# Patient Record
Sex: Male | Born: 1970 | Race: White | Hispanic: No | Marital: Married | Smoking: Current some day smoker
Health system: Southern US, Community
[De-identification: ages and names within clinical notes are randomized; demographics above are authoritative.]

## PROBLEM LIST (undated history)

## (undated) DIAGNOSIS — Z87442 Personal history of urinary calculi: Secondary | ICD-10-CM

## (undated) DIAGNOSIS — T7840XA Allergy, unspecified, initial encounter: Secondary | ICD-10-CM

## (undated) DIAGNOSIS — M549 Dorsalgia, unspecified: Secondary | ICD-10-CM

## (undated) HISTORY — PX: COLONOSCOPY: SHX174

## (undated) HISTORY — DX: Dorsalgia, unspecified: M54.9

## (undated) HISTORY — PX: NASAL SINUS SURGERY: SHX719

## (undated) HISTORY — DX: Allergy, unspecified, initial encounter: T78.40XA

---

## 2002-11-28 ENCOUNTER — Encounter: Admission: RE | Admit: 2002-11-28 | Discharge: 2003-02-26 | Payer: Self-pay | Admitting: Family Medicine

## 2004-09-24 ENCOUNTER — Encounter: Admission: RE | Admit: 2004-09-24 | Discharge: 2004-09-24 | Payer: Self-pay | Admitting: Internal Medicine

## 2004-09-28 ENCOUNTER — Encounter: Admission: RE | Admit: 2004-09-28 | Discharge: 2004-09-28 | Payer: Self-pay | Admitting: Internal Medicine

## 2007-08-15 ENCOUNTER — Ambulatory Visit (HOSPITAL_COMMUNITY): Admission: RE | Admit: 2007-08-15 | Discharge: 2007-08-15 | Payer: Self-pay | Admitting: *Deleted

## 2011-05-12 NOTE — Op Note (Signed)
Jerome Robinson, Jerome Robinson                ACCOUNT NO.:  [id number]   MEDICAL RECORD NO.:  [id number]          PATIENT TYPE:  AMB   LOCATION:  ENDO                         FACILITY:  [facility]   PHYSICIAN:  Georgiana Spinner, M.D.    DATE OF BIRTH:  September 21, 1971   DATE OF PROCEDURE:  08/15/2007  DATE OF DISCHARGE:                               OPERATIVE REPORT   PROCEDURES:  Colonoscopy.   INDICATIONS:  Colon polyps screening.   ANESTHESIA:  Demerol 100 mg, Versed 10 mg.   PROCEDURE:  With the patient mildly sedated in the left lateral  decubitus position, the Pentax videoscopic colonoscope was inserted in  the rectum after normal rectal exam; and passed under direct vision to  the cecum, identified by ileocecal valve and appendiceal orifice, both  of which were photographed.  From this point the colonoscope was slowly  withdrawn, taking circumferential views of the colonic mucosa and  stopping only in the rectum, which appeared normal on direct and showed  hemorrhoids on retroflexed view.  The endoscope was straightened and  withdrawn.  The patient's vital signs, pulse oximetry remained stable.  The patient tolerated the procedure well without apparent complications.   FINDINGS:  Internal hemorrhoids, otherwise an unremarkable exam.   PLAN:  Have patient follow-up with me in 5 years or as needed.           ______________________________  Georgiana Spinner, M.D.     GMO/MEDQ  D:  08/15/2007  T:  08/15/2007  Job:  [id number]

## 2013-02-20 ENCOUNTER — Other Ambulatory Visit: Payer: Self-pay | Admitting: Internal Medicine

## 2013-02-20 DIAGNOSIS — N5089 Other specified disorders of the male genital organs: Secondary | ICD-10-CM

## 2013-02-22 ENCOUNTER — Other Ambulatory Visit: Payer: Self-pay | Admitting: Internal Medicine

## 2013-02-22 ENCOUNTER — Ambulatory Visit
Admission: RE | Admit: 2013-02-22 | Discharge: 2013-02-22 | Disposition: A | Discharge status: Home / Self Care | Payer: 59 | Source: Ambulatory Visit | Attending: Internal Medicine | Admitting: Internal Medicine

## 2013-02-22 DIAGNOSIS — N5089 Other specified disorders of the male genital organs: Secondary | ICD-10-CM

## 2013-06-01 ENCOUNTER — Encounter: Payer: Self-pay | Admitting: Gastroenterology

## 2013-06-27 ENCOUNTER — Encounter: Payer: Self-pay | Admitting: Internal Medicine

## 2013-06-29 ENCOUNTER — Other Ambulatory Visit: Payer: Self-pay | Admitting: Physical Medicine and Rehabilitation

## 2013-06-29 DIAGNOSIS — M79604 Pain in right leg: Secondary | ICD-10-CM

## 2013-06-29 DIAGNOSIS — Z139 Encounter for screening, unspecified: Secondary | ICD-10-CM

## 2013-06-29 DIAGNOSIS — M545 Low back pain, unspecified: Secondary | ICD-10-CM

## 2013-07-13 ENCOUNTER — Ambulatory Visit
Admission: RE | Admit: 2013-07-13 | Discharge: 2013-07-13 | Disposition: A | Discharge status: Home / Self Care | Payer: 59 | Source: Ambulatory Visit | Attending: Physical Medicine and Rehabilitation | Admitting: Physical Medicine and Rehabilitation

## 2013-07-13 DIAGNOSIS — Z139 Encounter for screening, unspecified: Secondary | ICD-10-CM

## 2013-07-13 DIAGNOSIS — M545 Low back pain, unspecified: Secondary | ICD-10-CM

## 2013-07-13 DIAGNOSIS — M79604 Pain in right leg: Secondary | ICD-10-CM

## 2013-07-17 ENCOUNTER — Ambulatory Visit (AMBULATORY_SURGERY_CENTER): Payer: 59 | Admitting: *Deleted

## 2013-07-17 ENCOUNTER — Encounter: Payer: Self-pay | Admitting: Internal Medicine

## 2013-07-17 VITALS — Ht 71.0 in | Wt 193.6 lb

## 2013-07-17 DIAGNOSIS — Z800 Family history of malignant neoplasm of digestive organs: Secondary | ICD-10-CM

## 2013-07-17 MED ORDER — MOVIPREP 100 G PO SOLR: ORAL | Status: DC

## 2013-07-17 NOTE — Progress Notes (Signed)
No egg or soy allergy  Pt had last colonoscopy with Dr. Virginia Rochester in 2008. He states he didn't have polyps  Dr. Wende Neighbors report is in pt's file

## 2013-08-01 ENCOUNTER — Ambulatory Visit (AMBULATORY_SURGERY_CENTER): Payer: 59 | Admitting: Internal Medicine

## 2013-08-01 ENCOUNTER — Encounter: Payer: Self-pay | Admitting: Internal Medicine

## 2013-08-01 VITALS — BP 99/73 | HR 54 | Temp 97.0°F | Resp 19 | Ht 71.0 in | Wt 193.0 lb

## 2013-08-01 DIAGNOSIS — D126 Benign neoplasm of colon, unspecified: Secondary | ICD-10-CM

## 2013-08-01 DIAGNOSIS — Z1211 Encounter for screening for malignant neoplasm of colon: Secondary | ICD-10-CM

## 2013-08-01 DIAGNOSIS — Z800 Family history of malignant neoplasm of digestive organs: Secondary | ICD-10-CM

## 2013-08-01 HISTORY — PX: COLONOSCOPY: SHX174

## 2013-08-01 MED ORDER — SODIUM CHLORIDE 0.9 % IV SOLN: 500.0000 mL | INTRAVENOUS | Status: DC

## 2013-08-01 NOTE — Progress Notes (Signed)
Called to room to assist during endoscopic procedure.  Patient ID and intended procedure confirmed with present staff. Received instructions for my participation in the procedure from the performing physician.  

## 2013-08-01 NOTE — Progress Notes (Signed)
Patient did not have preoperative order for IV antibiotic SSI prophylaxis. (G8918)  Patient did not experience any of the following events: a burn prior to discharge; a fall within the facility; wrong site/side/patient/procedure/implant event; or a hospital transfer or hospital admission upon discharge from the facility. (G8907)  

## 2013-08-01 NOTE — Patient Instructions (Addendum)

## 2013-08-01 NOTE — Op Note (Signed)
[facility] [address] [place], [address]   COLONOSCOPY PROCEDURE REPORT  PATIENT: Jerome Robinson, Jerome Robinson  MR#: [id number] BIRTHDATE: 1971-07-12 , 42  yrs. old GENDER: Male ENDOSCOPIST: Roxy Cedar, MD REFERRED AO:[id number] Ricki Miller, M.D. PROCEDURE DATE:  08/01/2013 PROCEDURE:   Colonoscopy with snare polypectomy x 1 First Screening Colonoscopy - Avg.  risk and is 50 yrs.  old or older - No.  Prior Negative Screening - Now for repeat screening. Above average risk  History of Adenoma - Now for follow-up colonoscopy & has been > or = to 3 yrs.  N/A  Polyps Removed Today? Yes. ASA CLASS:   Class II INDICATIONS:Patient's immediate family history of colon cancer.  Mom @ 35.  Negative index exam 2008 (Dr Virginia Rochester) MEDICATIONS: MAC sedation, administered by CRNA and propofol (Diprivan) 260mg  IV  DESCRIPTION OF PROCEDURE:   After the risks benefits and alternatives of the procedure were thoroughly explained, informed consent was obtained.  A digital rectal exam revealed no abnormalities of the rectum.   The LB [id number]  endoscope was introduced through the anus and advanced to the cecum, which was identified by both the appendix and ileocecal valve. No adverse events experienced.   The quality of the prep was excellent, using MoviPrep  The instrument was then slowly withdrawn as the colon was fully examined.      COLON FINDINGS: A diminutive polyp was found in the sigmoid colon. A polypectomy was performed with a cold snare.  The resection was complete and the polyp tissue was completely retrieved.   The colon was otherwise normal.  There was no diverticulosis, inflammation, other polyps or cancers .  Retroflexed views revealed internal hemorrhoids. The time to cecum=1 minutes 47 seconds.  Withdrawal time=9 minutes 20 seconds.  The scope was withdrawn and the procedure completed. COMPLICATIONS: There were no complications.  ENDOSCOPIC IMPRESSION: 1.   Diminutive  polyp was found in the sigmoid colon; polypectomy was performed with a cold snare 2.   The colon was otherwise normal  RECOMMENDATIONS: 1. Follow up colonoscopy in 5 years (Family Hx)   eSigned:  Roxy Cedar, MD 08/01/2013 11:49 AM   cc: Juline Patch, MD and The Patient   PATIENT NAME:  Jerome Robinson, Jerome Robinson MR#: [id number]

## 2013-08-02 ENCOUNTER — Telehealth: Payer: Self-pay | Admitting: *Deleted

## 2013-08-02 NOTE — Telephone Encounter (Signed)
  Follow up Call-  Call back number 08/01/2013  Post procedure Call Back phone  # [phone]  Permission to leave phone message Yes    [facility]

## 2013-08-14 ENCOUNTER — Encounter: Payer: Self-pay | Admitting: Internal Medicine

## 2013-12-28 HISTORY — PX: BACK SURGERY: SHX140

## 2015-11-19 ENCOUNTER — Other Ambulatory Visit: Payer: Self-pay | Admitting: Internal Medicine

## 2015-11-19 DIAGNOSIS — R7989 Other specified abnormal findings of blood chemistry: Secondary | ICD-10-CM

## 2015-11-19 DIAGNOSIS — R945 Abnormal results of liver function studies: Secondary | ICD-10-CM

## 2015-11-25 ENCOUNTER — Other Ambulatory Visit: Payer: Self-pay

## 2015-11-29 ENCOUNTER — Ambulatory Visit
Admission: RE | Admit: 2015-11-29 | Discharge: 2015-11-29 | Disposition: A | Discharge status: Home / Self Care | Payer: Self-pay | Source: Ambulatory Visit | Attending: Internal Medicine | Admitting: Internal Medicine

## 2015-11-29 DIAGNOSIS — R7989 Other specified abnormal findings of blood chemistry: Secondary | ICD-10-CM

## 2015-11-29 DIAGNOSIS — R945 Abnormal results of liver function studies: Secondary | ICD-10-CM

## 2017-06-09 DIAGNOSIS — Z125 Encounter for screening for malignant neoplasm of prostate: Secondary | ICD-10-CM | POA: Diagnosis not present

## 2017-06-09 DIAGNOSIS — Z0000 Encounter for general adult medical examination without abnormal findings: Secondary | ICD-10-CM | POA: Diagnosis not present

## 2017-07-21 DIAGNOSIS — G4700 Insomnia, unspecified: Secondary | ICD-10-CM | POA: Diagnosis not present

## 2017-07-21 DIAGNOSIS — R002 Palpitations: Secondary | ICD-10-CM | POA: Diagnosis not present

## 2017-07-21 DIAGNOSIS — Z0000 Encounter for general adult medical examination without abnormal findings: Secondary | ICD-10-CM | POA: Diagnosis not present

## 2017-07-21 DIAGNOSIS — N401 Enlarged prostate with lower urinary tract symptoms: Secondary | ICD-10-CM | POA: Diagnosis not present

## 2018-07-27 DIAGNOSIS — Z125 Encounter for screening for malignant neoplasm of prostate: Secondary | ICD-10-CM | POA: Diagnosis not present

## 2018-07-27 DIAGNOSIS — Z0000 Encounter for general adult medical examination without abnormal findings: Secondary | ICD-10-CM | POA: Diagnosis not present

## 2018-08-04 ENCOUNTER — Encounter: Payer: Self-pay | Admitting: Internal Medicine

## 2018-08-04 DIAGNOSIS — Z0000 Encounter for general adult medical examination without abnormal findings: Secondary | ICD-10-CM | POA: Diagnosis not present

## 2019-03-16 DIAGNOSIS — L720 Epidermal cyst: Secondary | ICD-10-CM | POA: Diagnosis not present

## 2019-03-16 DIAGNOSIS — L718 Other rosacea: Secondary | ICD-10-CM | POA: Diagnosis not present

## 2019-03-16 DIAGNOSIS — L573 Poikiloderma of Civatte: Secondary | ICD-10-CM | POA: Diagnosis not present

## 2021-07-14 ENCOUNTER — Other Ambulatory Visit: Payer: Self-pay | Admitting: Student

## 2021-07-14 DIAGNOSIS — M5416 Radiculopathy, lumbar region: Secondary | ICD-10-CM

## 2021-08-07 ENCOUNTER — Other Ambulatory Visit: Payer: Self-pay

## 2021-08-07 ENCOUNTER — Ambulatory Visit
Admission: RE | Admit: 2021-08-07 | Discharge: 2021-08-07 | Disposition: A | Discharge status: Home / Self Care | Payer: Worker's Compensation | Source: Ambulatory Visit | Attending: Student | Admitting: Student

## 2021-08-07 DIAGNOSIS — M5416 Radiculopathy, lumbar region: Secondary | ICD-10-CM

## 2022-01-04 IMAGING — CT CT L SPINE W/O CM
4 series · 15 of 33 positions shown, 18 images · non-contrast
Comparison: MRI lumbar spine dated June 24, 2021.

CLINICAL DATA: Lumbar radiculopathy.  History of prior discectomy.

EXAM:
CT LUMBAR SPINE WITHOUT CONTRAST
TECHNIQUE: Multidetector CT imaging of the lumbar spine was performed without
intravenous contrast administration. Intradiscal contrast was
administered at an outside facility prior to scanning. Multiplanar
CT image reconstructions were also generated.

[Series 3: l-spine 2.00 br40 s3 (person_name) · axial · 0.31mm/px · z∈[+1427,+1525]mm · 5 of 75 slices shown, 7 images]
[im 13/75  soft-tissue]
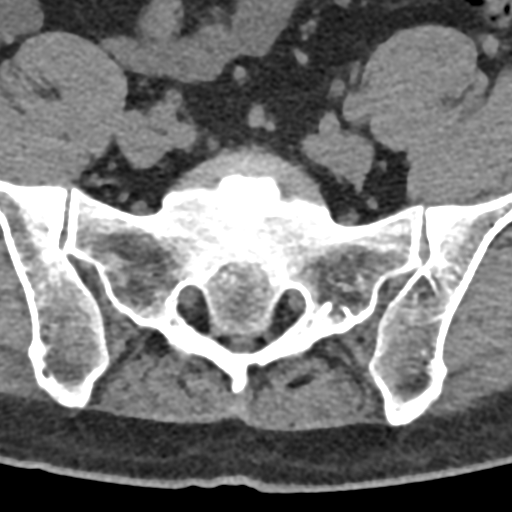
[im 13/75  bone]
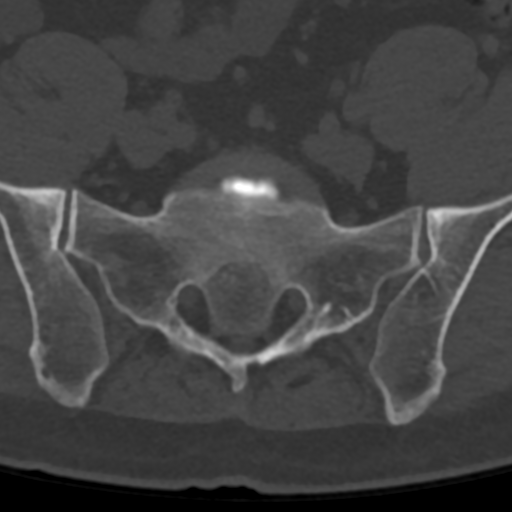
[im 25/75  bone]
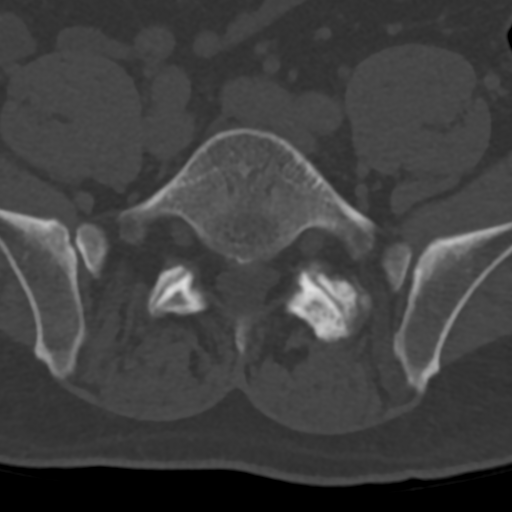
[im 38/75  bone]
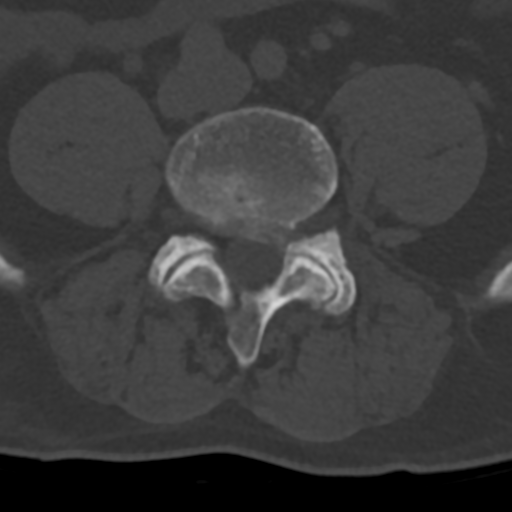
[im 50/75  bone]
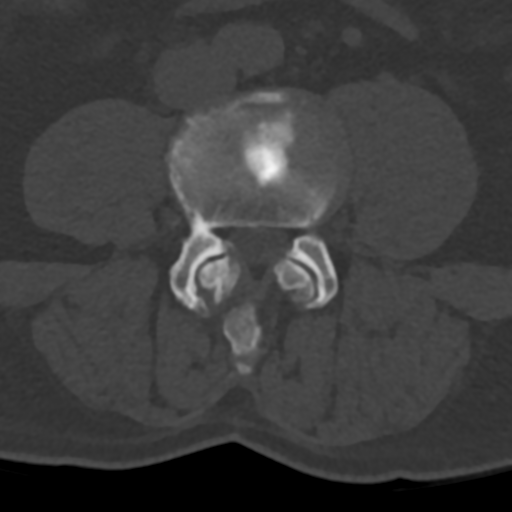
[im 62/75  soft-tissue]
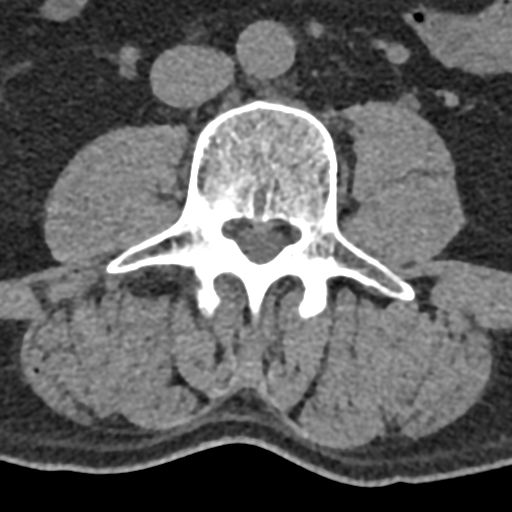
[im 62/75  bone]
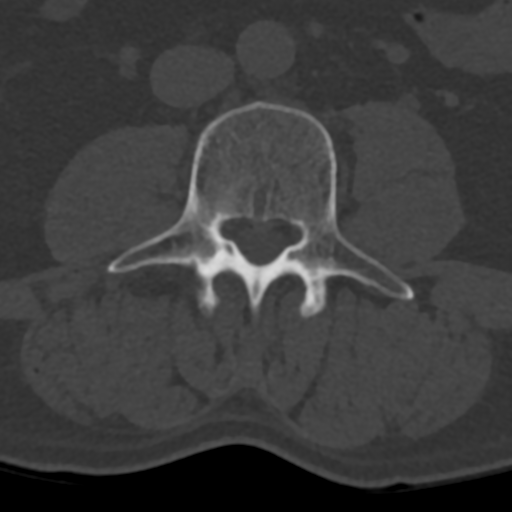

[Series 4: l-spine 2.00 br60 s3 lspine bone · axial · 0.31mm/px · z∈[+1427,+1451]mm · 2 of 75 slices shown]
[im 13/75  bone]
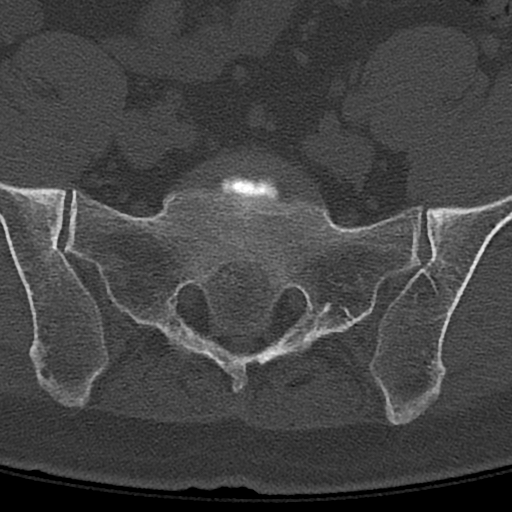
[im 25/75  bone]
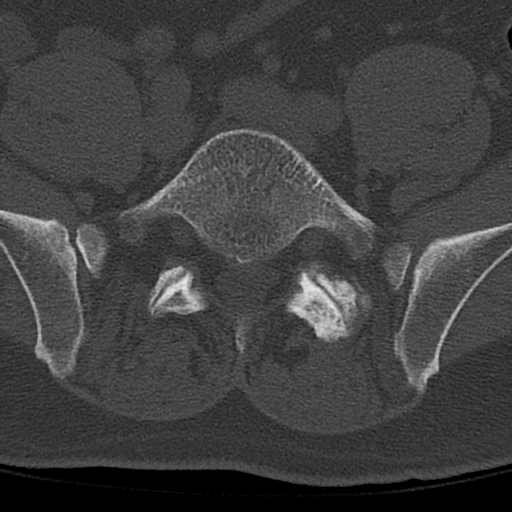

[Series 7: l-spine 2.00 br44 s3 sag soft · sagittal · 0.30mm/px · 5 of 80 slices shown, 6 images]
[im 27/80  bone]
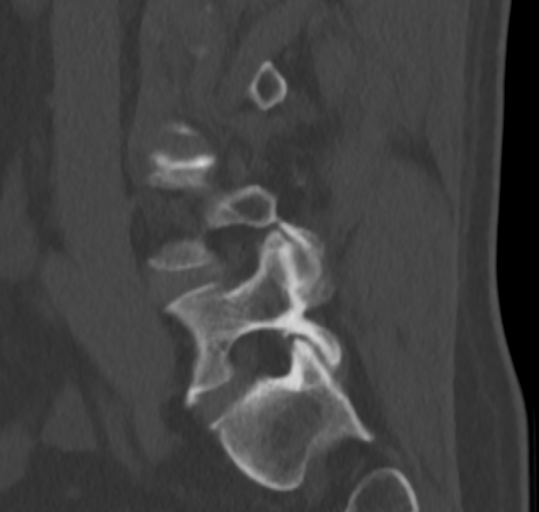
[im 33/80  bone]
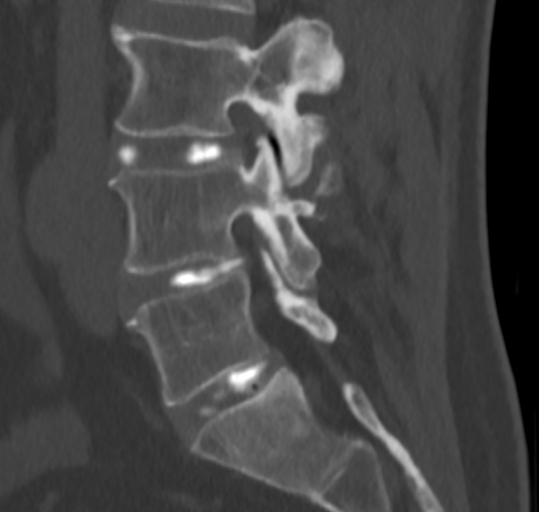
[im 40/80  soft-tissue]
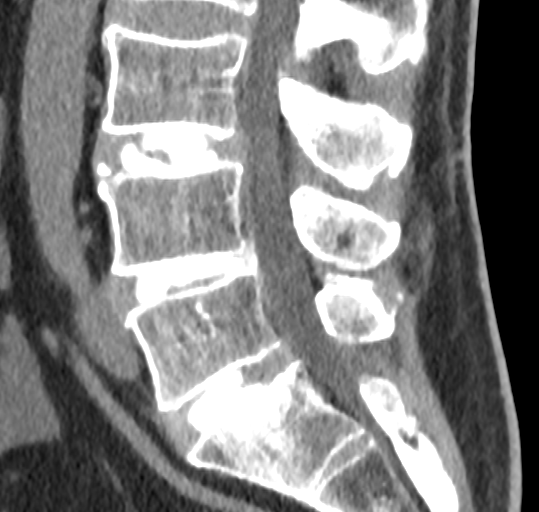
[im 40/80  bone]
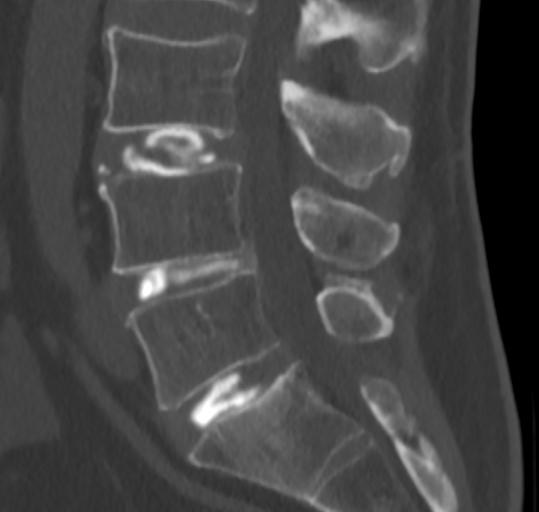
[im 47/80  bone]
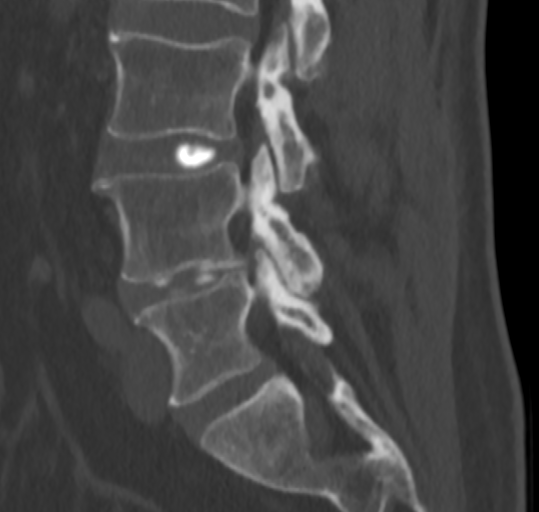
[im 53/80  bone]
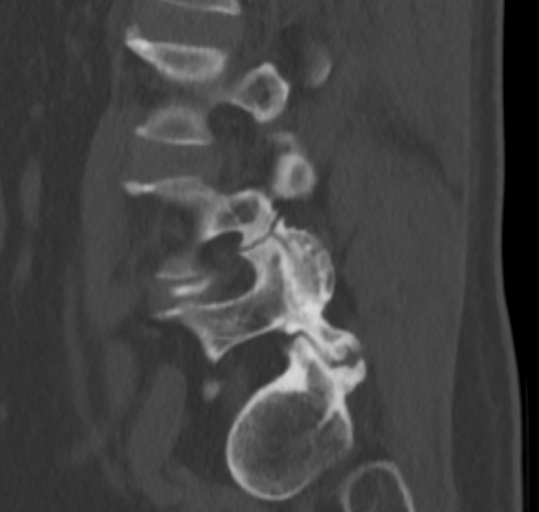

[Series 9: l-spine 2.00 br60 s3 cor bone · coronal · 0.30mm/px · 3 of 79 slices shown]
[im 16/79  bone]
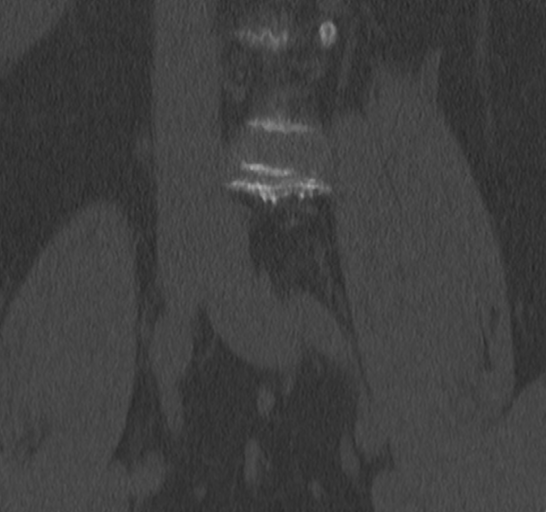
[im 32/79  bone]
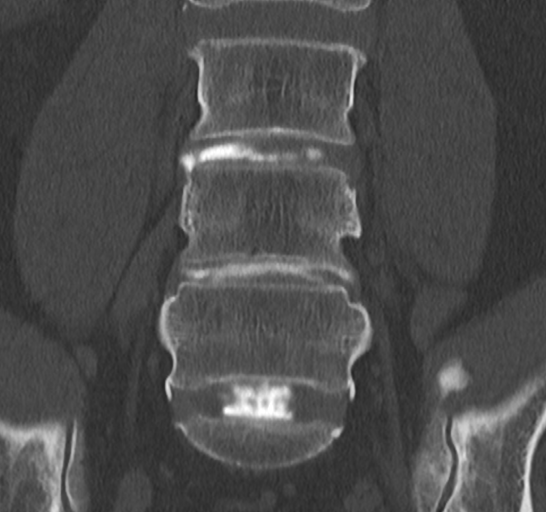
[im 47/79  bone]
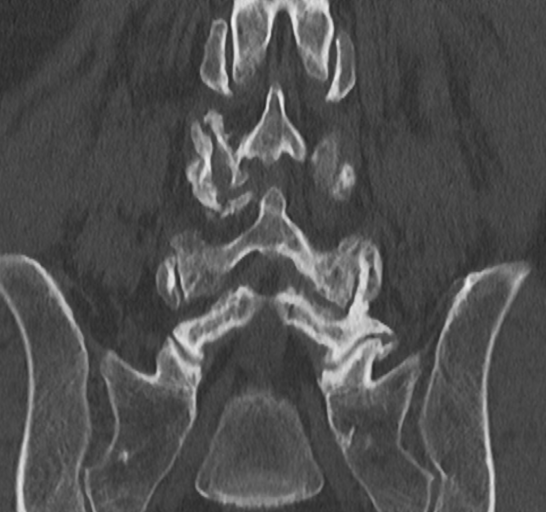

[15 of 33 positions shown; findings below may reference images not displayed]

FINDINGS: Alignment: Trace anterolisthesis at L3-L4 and L4-L5.

Vertebrae: No acute fracture or focal pathologic process.

Paraspinal and other soft tissues: Negative.

Disc levels:

L3-L4: Mild disc bulging. Grade 4 radial annular tear. Moderate
bilateral facet arthropathy. No stenosis.

L4-L5: Prior left hemilaminectomy. Mild disc bulging with small
shallow left subarticular disc protrusion (series 3 and 4, image 41;
series 5, image 45). Grade 4 radial annular tear. Increased soft
tissue density in the left lateral recess previously demonstrated to
be scar on prior MRIs. Moderate left greater than right facet
arthropathy. Unchanged mild left neuroforaminal stenosis. No spinal
canal or right neuroforaminal stenosis.

L5-S1: Grade 3 radial annular tear. No significant disc bulge.
Moderate left and mild right facet arthropathy. No stenosis.
IMPRESSION: 1. Postsurgical changes at L4-L5 with small shallow left
subarticular disc protrusion and scarring in the left lateral recess
which could continue to irritate the descending left L5 nerve root.
2. Grade 4 radial annular tears at L3-L4 and L4-L5. Grade 3 radial
annular tear at L5-S1.

## 2022-01-27 ENCOUNTER — Encounter: Payer: Self-pay | Admitting: Family Medicine

## 2022-01-29 ENCOUNTER — Other Ambulatory Visit: Payer: Self-pay | Admitting: Neurological Surgery

## 2022-04-22 ENCOUNTER — Ambulatory Visit (AMBULATORY_SURGERY_CENTER): Payer: 59 | Admitting: *Deleted

## 2022-04-22 VITALS — Ht 71.0 in | Wt 208.0 lb

## 2022-04-22 DIAGNOSIS — Z800 Family history of malignant neoplasm of digestive organs: Secondary | ICD-10-CM

## 2022-04-22 MED ORDER — NA SULFATE-K SULFATE-MG SULF 17.5-3.13-1.6 GM/177ML PO SOLN: 1.0000 | ORAL | 0 refills | Status: DC

## 2022-04-22 NOTE — Progress Notes (Signed)
Patient's pre-visit was done today over the phone with the patient. Name,DOB and address verified. Patient denies any allergies to Eggs and Soy. Patient denies any problems with anesthesia/sedation. Patient is not taking any diet pills or blood thinners. No home Oxygen. Insurance confirmed with patient. ? ?Prep instructions sent to pt's MyChart (if available) or mailed to pt-pt is aware. Patient understands to call us back with any questions or concerns. Patient is aware of our care-partner policy. Pt aware not to use chew tobacco day of procedure or the procedure will be cancelled !!--pt aware. ? ? ?EMMI education assigned to the patient for the procedure, sent to [facility].  ? ?The patient is COVID-19 vaccinated.   ?

## 2022-05-12 ENCOUNTER — Encounter: Payer: Self-pay | Admitting: Internal Medicine

## 2022-05-12 ENCOUNTER — Ambulatory Visit (AMBULATORY_SURGERY_CENTER): Payer: 59 | Admitting: Internal Medicine

## 2022-05-12 VITALS — BP 100/53 | HR 53 | Temp 98.6°F | Resp 15 | Ht 71.0 in | Wt 208.0 lb

## 2022-05-12 DIAGNOSIS — D120 Benign neoplasm of cecum: Secondary | ICD-10-CM

## 2022-05-12 DIAGNOSIS — D124 Benign neoplasm of descending colon: Secondary | ICD-10-CM

## 2022-05-12 DIAGNOSIS — Z800 Family history of malignant neoplasm of digestive organs: Secondary | ICD-10-CM | POA: Diagnosis not present

## 2022-05-12 DIAGNOSIS — D123 Benign neoplasm of transverse colon: Secondary | ICD-10-CM | POA: Diagnosis not present

## 2022-05-12 DIAGNOSIS — Z1211 Encounter for screening for malignant neoplasm of colon: Secondary | ICD-10-CM | POA: Diagnosis not present

## 2022-05-12 MED ORDER — SODIUM CHLORIDE 0.9 % IV SOLN: 500.0000 mL | Freq: Once | INTRAVENOUS | Status: DC

## 2022-05-12 NOTE — Progress Notes (Signed)
HISTORY OF PRESENT ILLNESS: ? ?Jerome Robinson is a 51 y.o. male with family history of colon cancer in his mother at age 79.  Previous examinations 2008 (Dr. Lajoyce Corners) in 2014 were negative for neoplasia.  No active complaints ? ?REVIEW OF SYSTEMS: ? ?All non-GI ROS negative.  r ? ?Past Medical History:  ?Diagnosis Date  ? Allergy   ? Back pain   ? ? ?Past Surgical History:  ?Procedure Laterality Date  ? BACK SURGERY  2015  ? COLONOSCOPY  08/01/2013  ? Dr.Jonet Mathies  ? NASAL SINUS SURGERY    ? ? ?Social History ?Jerome Robinson  reports that he has been smoking cigars. His smokeless tobacco use includes chew. He reports current alcohol use of about 12.0 standard drinks per week. He reports that he does not use drugs. ? ?family history includes Colon cancer (age of onset: 51) in his mother. ? ?Allergies  ?Allergen Reactions  ? Prednisone Itching  ? ? ?  ? ?PHYSICAL EXAMINATION: ? ?Vital signs: BP 95/62   Pulse 61   Temp 98.6 ?F (37 ?C) (Skin)   Resp 12   Ht '5\' 11"'$  (1.803 m)   Wt 208 lb (94.3 kg)   SpO2 98%   BMI 29.01 kg/m?  ?General: Well-developed, well-nourished, no acute distress ?HEENT: Sclerae are anicteric, conjunctiva pink. Oral mucosa intact ?Lungs: Clear ?Heart: Regular ?Abdomen: soft, nontender, nondistended, no obvious ascites, no peritoneal signs, normal bowel sounds. No organomegaly. ?Extremities: No edema ?Psychiatric: alert and oriented x3. Cooperative  ? ? ? ?ASSESSMENT: ? ?Family history of colon cancer ? ? ?PLAN: ? ?High risk screening colonoscopy ? ? ? ? ?  ?

## 2022-05-12 NOTE — Patient Instructions (Signed)
Handout given on polyps. ? ?YOU HAD AN ENDOSCOPIC PROCEDURE TODAY AT [facility]:   Refer to the procedure report that was given to you for any specific questions about what was found during the examination.  If the procedure report does not answer your questions, please call your gastroenterologist to clarify.  If you requested that your care partner not be given the details of your procedure findings, then the procedure report has been included in a sealed envelope for you to review at your convenience later. ? ?YOU SHOULD EXPECT: Some feelings of bloating in the abdomen. Passage of more gas than usual.  Walking can help get rid of the air that was put into your GI tract during the procedure and reduce the bloating. If you had a lower endoscopy (such as a colonoscopy or flexible sigmoidoscopy) you may notice spotting of blood in your stool or on the toilet paper. If you underwent a bowel prep for your procedure, you may not have a normal bowel movement for a few days. ? ?Please Note:  You might notice some irritation and congestion in your nose or some drainage.  This is from the oxygen used during your procedure.  There is no need for concern and it should clear up in a day or so. ? ?SYMPTOMS TO REPORT IMMEDIATELY: ? ?Following lower endoscopy (colonoscopy or flexible sigmoidoscopy): ? Excessive amounts of blood in the stool ? Significant tenderness or worsening of abdominal pains ? Swelling of the abdomen that is new, acute ? Fever of 100?F or higher ? ? ?For urgent or emergent issues, a gastroenterologist can be reached at any hour by calling [phone]. ?Do not use MyChart messaging for urgent concerns.  ? ? ?DIET:  We do recommend a small meal at first, but then you may proceed to your regular diet.  Drink plenty of fluids but you should avoid alcoholic beverages for 24 hours. ? ?ACTIVITY:  You should plan to take it easy for the rest of today and you should NOT DRIVE or use heavy  machinery until tomorrow (because of the sedation medicines used during the test).   ? ?FOLLOW UP: ?Our staff will call the number listed on your records 48-72 hours following your procedure to check on you and address any questions or concerns that you may have regarding the information given to you following your procedure. If we do not reach you, we will leave a message.  We will attempt to reach you two times.  During this call, we will ask if you have developed any symptoms of COVID 19. If you develop any symptoms (ie: fever, flu-like symptoms, shortness of breath, cough etc.) before then, please call [phone].  If you test positive for Covid 19 in the 2 weeks post procedure, please call and report this information to [place].   ? ?If any biopsies were taken you will be contacted by phone or by letter within the next 1-3 weeks.  Please call us at [phone] if you have not heard about the biopsies in 3 weeks.  ? ? ?SIGNATURES/CONFIDENTIALITY: ?You and/or your care partner have signed paperwork which will be entered into your electronic medical record.  These signatures attest to the fact that that the information above on your After Visit Summary has been reviewed and is understood.  Full responsibility of the confidentiality of this discharge information lies with you and/or your care-partner.  ?

## 2022-05-12 NOTE — Op Note (Signed)
[facility] ?Patient Name: Jerome Robinson ?Procedure Date: 05/12/2022 10:54 AM ?MRN: [id number] ?Endoscopist: Docia Chuck. Henrene Pastor , MD ?Age: 51 ?Referring MD:  ?Date of Birth: 03-Mar-1971 ?Gender: Male ?Account #: [id number] ?Procedure:                Colonoscopy with cold snare polypectomy x 3; biopsy  ?                          polypectomy x 1 ?Indications:              Screening in patient at increased risk: Colorectal  ?                          cancer in mother before age 40 (age 62). Previous  ?                          examinations 2008 (Dr. Lajoyce Corners); 2014 were both  ?                          negative for neoplasia ?Medicines:                Monitored Anesthesia Care ?Procedure:                Pre-Anesthesia Assessment: ?                          - Prior to the procedure, a History and Physical  ?                          was performed, and patient medications and  ?                          allergies were reviewed. The patient's tolerance of  ?                          previous anesthesia was also reviewed. The risks  ?                          and benefits of the procedure and the sedation  ?                          options and risks were discussed with the patient.  ?                          All questions were answered, and informed consent  ?                          was obtained. Prior Anticoagulants: The patient has  ?                          taken no previous anticoagulant or antiplatelet  ?                          agents. ASA Grade Assessment: II - A patient with  ?  mild systemic disease. After reviewing the risks  ?                          and benefits, the patient was deemed in  ?                          satisfactory condition to undergo the procedure. ?                          After obtaining informed consent, the colonoscope  ?                          was passed under direct vision. Throughout the  ?                          procedure, the patient's blood pressure,  pulse, and  ?                          oxygen saturations were monitored continuously. The  ?                          CF [id number] #[id number] was introduced through the anus  ?                          and advanced to the the cecum, identified by  ?                          appendiceal orifice and ileocecal valve. The  ?                          ileocecal valve, appendiceal orifice, and rectum  ?                          were photographed. The quality of the bowel  ?                          preparation was excellent. The colonoscopy was  ?                          performed without difficulty. The patient tolerated  ?                          the procedure well. The bowel preparation used was  ?                          SUPREP via split dose instruction. ?Scope In: 11:08:55 AM ?Scope Out: 11:21:53 AM ?Scope Withdrawal Time: 0 hours 11 minutes 20 seconds  ?Total Procedure Duration: 0 hours 12 minutes 58 seconds  ?Findings:                 Three polyps were found in the transverse colon and  ?                          cecum. The polyps were 2 to 5 mm  in size. These  ?                          polyps were removed with a cold snare. Resection  ?                          and retrieval were complete. ?                          A less than 1 mm polyp was found in the descending  ?                          colon. The polyp was removed with a cold snare.  ?                          Resection and retrieval were complete. ?                          The exam was otherwise without abnormality on  ?                          direct and retroflexion views. ?Complications:            No immediate complications. Estimated blood loss:  ?                          None. ?Estimated Blood Loss:     Estimated blood loss: none. ?Impression:               - Three 2 to 5 mm polyps in the transverse colon  ?                          and in the cecum, removed with a cold snare.  ?                          Resected and retrieved. ?                           - One less than 1 mm polyp in the descending colon,  ?                          removed with a cold snare. Resected and retrieved. ?                          - The examination was otherwise normal on direct  ?                          and retroflexion views. ?Recommendation:           - Repeat colonoscopy in 5 years for surveillance  ?                          (family history). ?                          - Patient  has a contact number available for  ?                          emergencies. The signs and symptoms of potential  ?                          delayed complications were discussed with the  ?                          patient. Return to normal activities tomorrow.  ?                          Written discharge instructions were provided to the  ?                          patient. ?                          - Resume previous diet. ?                          - Continue present medications. ?                          - Await pathology results. ?Docia Chuck. Henrene Pastor, MD ?05/12/2022 11:29:14 AM ?This report has been signed electronically. ?

## 2022-05-12 NOTE — Progress Notes (Signed)
Pt's states no medical or surgical changes since previsit or office visit. ? ?Per pt- last used chew 05-11-22 ?

## 2022-05-12 NOTE — Progress Notes (Signed)
VSS, transported to PACU °

## 2022-05-14 ENCOUNTER — Telehealth: Payer: Self-pay | Admitting: *Deleted

## 2022-05-14 ENCOUNTER — Encounter: Payer: Self-pay | Admitting: Internal Medicine

## 2022-05-14 NOTE — Telephone Encounter (Signed)
  Follow up Call-     05/12/2022   10:12 AM  Call back number  Post procedure Call Back phone  # [phone]  Permission to leave phone message Yes     Patient questions:  Do you have a fever, pain , or abdominal swelling? No. Pain Score  0 *  Have you tolerated food without any problems? Yes.    Have you been able to return to your normal activities? Yes.    Do you have any questions about your discharge instructions: Diet   No. Medications  No. Follow up visit  No.  Do you have questions or concerns about your Care? No.  Actions: * If pain score is 4 or above: No action needed, pain <4.

## 2022-05-14 NOTE — Telephone Encounter (Signed)
Attempted to call patient for their post-procedure follow-up call. No answer. Left voicemail.   

## 2022-11-17 NOTE — Pre-Procedure Instructions (Signed)
Surgical Instructions    Your procedure is scheduled on Monday, November 30, 2022 at 7:30 AM.  Report to [facility] Main Entrance "A" at 5:30 A.M., then check in with the Admitting office.  Call this number if you have problems the morning of surgery:  [phone]   If you have any questions prior to your surgery date call [phone]: Open Monday-Friday 8am-4pm  *If you experience any cold or flu symptoms such as cough, fever, chills, shortness of breath, etc. between now and your scheduled surgery, please notify us.*    Remember:  Do not eat or drink after midnight the night before your surgery    Take these medicines the morning of surgery with A SIP OF WATER:  escitalopram (LEXAPRO)  pregabalin (LYRICA)   IF NEEDED: fluticasone (FLONASE)  methocarbamol (ROBAXIN)  naltrexone (DEPADE)  oxyCODONE-acetaminophen (PERCOCET/ROXICET)    As of today, STOP taking any Aspirin (unless otherwise instructed by your surgeon) Aleve, Naproxen, Ibuprofen, Motrin, Advil, Goody's, BC's, all herbal medications, fish oil, and all vitamins.                     Do NOT Smoke (Tobacco/Vaping) for 24 hours prior to your procedure.  If you use a CPAP at night, you may bring your mask/headgear for your overnight stay.   Contacts, glasses, piercing's, hearing aid's, dentures or partials may not be worn into surgery, please bring cases for these belongings.    For patients admitted to the hospital, discharge time will be determined by your treatment team.   Patients discharged the day of surgery will not be allowed to drive home, and someone needs to stay with them for 24 hours.  SURGICAL WAITING ROOM VISITATION Patients having surgery or a procedure may have two support people in the waiting area. Visitors may stay in the waiting area during the procedure and switch out with other visitors if needed. Children under the age of 63 must have an adult accompany them who is not the patient. If the  patient needs to stay at the hospital during part of their recovery, the visitor guidelines for inpatient rooms apply.  Please refer to the [facility] website for the visitor guidelines for Inpatients (after your surgery is over and you are in a regular room).    Special instructions:   [facility]- Preparing For Surgery  Before surgery, you can play an important role. Because skin is not sterile, your skin needs to be as free of germs as possible. You can reduce the number of germs on your skin by washing with CHG (chlorahexidine gluconate) Soap before surgery.  CHG is an antiseptic cleaner which kills germs and bonds with the skin to continue killing germs even after washing.    Oral Hygiene is also important to reduce your risk of infection.  Remember - BRUSH YOUR TEETH THE MORNING OF SURGERY WITH YOUR REGULAR TOOTHPASTE  Please do not use if you have an allergy to CHG or antibacterial soaps. If your skin becomes reddened/irritated stop using the CHG.  Do not shave (including legs and underarms) for at least 48 hours prior to first CHG shower. It is OK to shave your face.  Please follow these instructions carefully.   Shower the NIGHT BEFORE SURGERY and the MORNING OF SURGERY  If you chose to wash your hair, wash your hair first as usual with your normal shampoo.  After you shampoo, rinse your hair and body thoroughly to remove the shampoo.  Use CHG  Soap as you would any other liquid soap. You can apply CHG directly to the skin and wash gently with a scrungie or a clean washcloth.   Apply the CHG Soap to your body ONLY FROM THE NECK DOWN.  Do not use on open wounds or open sores. Avoid contact with your eyes, ears, mouth and genitals (private parts). Wash Face and genitals (private parts)  with your normal soap.   Wash thoroughly, paying special attention to the area where your surgery will be performed.  Thoroughly rinse your body with warm water from the neck down.  DO NOT  shower/wash with your normal soap after using and rinsing off the CHG Soap.  Pat yourself dry with a CLEAN TOWEL.  Wear CLEAN PAJAMAS to bed the night before surgery  Place CLEAN SHEETS on your bed the night before your surgery  DO NOT SLEEP WITH PETS.   Day of Surgery: Take a shower with CHG soap. Do not wear jewelry. Do not wear lotions, powders, colognes, or deodorant. Men may shave face and neck. Do not bring valuables to the hospital.  [facility] is not responsible for any belongings or valuables. Wear Clean/Comfortable clothing the morning of surgery Do not apply any deodorants/lotions.   Remember to brush your teeth WITH YOUR REGULAR TOOTHPASTE.   Please read over the following fact sheets that you were given.  If you received a COVID test during your pre-op visit  it is requested that you wear a mask when out in public, stay away from anyone that may not be feeling well and notify your surgeon if you develop symptoms. If you have been in contact with anyone that has tested positive in the last 10 days please notify you surgeon.  '

## 2022-11-18 ENCOUNTER — Other Ambulatory Visit: Payer: Self-pay

## 2022-11-18 ENCOUNTER — Encounter (HOSPITAL_COMMUNITY): Payer: Self-pay

## 2022-11-18 ENCOUNTER — Encounter (HOSPITAL_COMMUNITY)
Admission: RE | Admit: 2022-11-18 | Discharge: 2022-11-18 | Disposition: A | Discharge status: Home / Self Care | Payer: No Typology Code available for payment source | Source: Ambulatory Visit | Attending: Neurological Surgery | Admitting: Neurological Surgery

## 2022-11-18 VITALS — BP 129/94 | HR 64 | Temp 98.0°F | Resp 17 | Ht 70.0 in | Wt 212.3 lb

## 2022-11-18 DIAGNOSIS — Z01818 Encounter for other preprocedural examination: Secondary | ICD-10-CM

## 2022-11-18 DIAGNOSIS — Z01812 Encounter for preprocedural laboratory examination: Secondary | ICD-10-CM | POA: Diagnosis present

## 2022-11-18 HISTORY — DX: Personal history of urinary calculi: Z87.442

## 2022-11-18 LAB — CBC
HCT: 48.3 % (ref 39.0–52.0)
Hemoglobin: 16.7 g/dL (ref 13.0–17.0)
MCH: 31.6 pg (ref 26.0–34.0)
MCHC: 34.6 g/dL (ref 30.0–36.0)
MCV: 91.5 fL (ref 80.0–100.0)
Platelets: 236 10*3/uL (ref 150–400)
RBC: 5.28 MIL/uL (ref 4.22–5.81)
RDW: 12.2 % (ref 11.5–15.5)
WBC: 7.7 10*3/uL (ref 4.0–10.5)
nRBC: 0 % (ref 0.0–0.2)

## 2022-11-18 LAB — COMPREHENSIVE METABOLIC PANEL
ALT: 57 U/L — ABNORMAL HIGH (ref 0–44)
AST: 37 U/L (ref 15–41)
Albumin: 4.3 g/dL (ref 3.5–5.0)
Alkaline Phosphatase: 54 U/L (ref 38–126)
Anion gap: 13 (ref 5–15)
BUN: 15 mg/dL (ref 6–20)
CO2: 22 mmol/L (ref 22–32)
Calcium: 9.5 mg/dL (ref 8.9–10.3)
Chloride: 104 mmol/L (ref 98–111)
Creatinine, Ser: 0.94 mg/dL (ref 0.61–1.24)
GFR, Estimated: >60 mL/min (ref >60)
Glucose, Bld: 108 mg/dL — ABNORMAL HIGH (ref 70–99)
Potassium: 4.4 mmol/L (ref 3.5–5.1)
Sodium: 139 mmol/L (ref 135–145)
Total Bilirubin: 1.1 mg/dL (ref 0.3–1.2)
Total Protein: 7.2 g/dL (ref 6.5–8.1)

## 2022-11-18 LAB — TYPE AND SCREEN
ABO/RH(D): O POS
Antibody Screen: NEGATIVE

## 2022-11-18 LAB — SURGICAL PCR SCREEN
MRSA, PCR: NEGATIVE
Staphylococcus aureus: POSITIVE — AB

## 2022-11-18 NOTE — Progress Notes (Signed)
PCP - [facility] Fuel Cardiologist - denies  PPM/ICD - denies Device Orders -  Rep Notified -   Chest x-ray - na EKG - none Stress Test - none ECHO - none Cardiac Cath - none  Sleep Study - denies CPAP -   Fasting Blood Sugar - na Checks Blood Sugar _____ times a day  Last dose of GLP1 agonist-  na GLP1 instructions:   Blood Thinner Instructions:na Aspirin Instructions:na  ERAS Protcol -no PRE-SURGERY Ensure or G2-   COVID TEST- na   Anesthesia review: no  Patient denies shortness of breath, fever, cough and chest pain at PAT appointment   All instructions explained to the patient, with a verbal understanding of the material. Patient agrees to go over the instructions while at home for a better understanding. Patient also instructed to wear a mask when out in public prior to surgery. The opportunity to ask questions was provided.

## 2022-11-29 NOTE — Anesthesia Preprocedure Evaluation (Addendum)
Anesthesia Evaluation  Patient identified by MRN, date of birth, ID band Patient awake    Reviewed: Allergy & Precautions, NPO status , Patient's Chart, lab work & pertinent test results  Airway Mallampati: I  TM Distance: >3 FB Neck ROM: Full    Dental no notable dental hx. (+) Dental Advisory Given, Teeth Intact   Pulmonary Current Smoker   Pulmonary exam normal breath sounds clear to auscultation       Cardiovascular negative cardio ROS Normal cardiovascular exam Rhythm:Regular Rate:Normal     Neuro/Psych negative neurological ROS     GI/Hepatic negative GI ROS,,,(+)     substance abuse  alcohol use  Endo/Other  negative endocrine ROS    Renal/GU negative Renal ROS     Musculoskeletal negative musculoskeletal ROS (+)    Abdominal  (+) + obese  Peds  Hematology negative hematology ROS (+)   Anesthesia Other Findings   Reproductive/Obstetrics                             Anesthesia Physical Anesthesia Plan  ASA: 2  Anesthesia Plan: General   Post-op Pain Management: Tylenol PO (pre-op)* and Gabapentin PO (pre-op)*   Induction: Intravenous  PONV Risk Score and Plan: 2 and Ondansetron, Dexamethasone, Treatment may vary due to age or medical condition and Midazolam  Airway Management Planned: Oral ETT  Additional Equipment:   Intra-op Plan:   Post-operative Plan: Extubation in OR  Informed Consent: I have reviewed the patients History and Physical, chart, labs and discussed the procedure including the risks, benefits and alternatives for the proposed anesthesia with the patient or authorized representative who has indicated his/her understanding and acceptance.     Dental advisory given  Plan Discussed with: CRNA  Anesthesia Plan Comments:         Anesthesia Quick Evaluation

## 2022-11-30 ENCOUNTER — Other Ambulatory Visit: Payer: Self-pay

## 2022-11-30 ENCOUNTER — Observation Stay (HOSPITAL_COMMUNITY)
Admission: RE | Admit: 2022-11-30 | Discharge: 2022-12-01 | Disposition: A | Discharge status: Home / Self Care | Payer: No Typology Code available for payment source | Attending: Neurological Surgery | Admitting: Neurological Surgery

## 2022-11-30 ENCOUNTER — Encounter (HOSPITAL_COMMUNITY): Payer: Self-pay | Admitting: Neurological Surgery

## 2022-11-30 ENCOUNTER — Ambulatory Visit (HOSPITAL_BASED_OUTPATIENT_CLINIC_OR_DEPARTMENT_OTHER): Payer: No Typology Code available for payment source | Admitting: Anesthesiology

## 2022-11-30 ENCOUNTER — Ambulatory Visit (HOSPITAL_COMMUNITY): Payer: 59

## 2022-11-30 ENCOUNTER — Ambulatory Visit (HOSPITAL_COMMUNITY): Payer: No Typology Code available for payment source | Admitting: Vascular Surgery

## 2022-11-30 ENCOUNTER — Encounter (HOSPITAL_COMMUNITY)
Admission: RE | Disposition: A | Discharge status: Home / Self Care | Payer: Self-pay | Source: Home / Self Care | Attending: Neurological Surgery

## 2022-11-30 DIAGNOSIS — F17290 Nicotine dependence, other tobacco product, uncomplicated: Secondary | ICD-10-CM | POA: Insufficient documentation

## 2022-11-30 DIAGNOSIS — Z79899 Other long term (current) drug therapy: Secondary | ICD-10-CM | POA: Diagnosis not present

## 2022-11-30 DIAGNOSIS — M47816 Spondylosis without myelopathy or radiculopathy, lumbar region: Principal | ICD-10-CM | POA: Insufficient documentation

## 2022-11-30 DIAGNOSIS — M961 Postlaminectomy syndrome, not elsewhere classified: Secondary | ICD-10-CM | POA: Diagnosis not present

## 2022-11-30 DIAGNOSIS — M5126 Other intervertebral disc displacement, lumbar region: Secondary | ICD-10-CM | POA: Insufficient documentation

## 2022-11-30 DIAGNOSIS — M5136 Other intervertebral disc degeneration, lumbar region: Secondary | ICD-10-CM | POA: Diagnosis not present

## 2022-11-30 DIAGNOSIS — Z7951 Long term (current) use of inhaled steroids: Secondary | ICD-10-CM | POA: Insufficient documentation

## 2022-11-30 DIAGNOSIS — Z981 Arthrodesis status: Secondary | ICD-10-CM

## 2022-11-30 LAB — ABO/RH: ABO/RH(D): O POS

## 2022-11-30 SURGERY — POSTERIOR LUMBAR FUSION 1 LEVEL
Anesthesia: General | Site: Back

## 2022-11-30 MED ORDER — ACETAMINOPHEN 500 MG PO TABS
1000.0000 mg | ORAL_TABLET | ORAL | Status: AC
  Administered 2022-11-30: 1000 mg via ORAL

## 2022-11-30 MED ORDER — GABAPENTIN 300 MG PO CAPS
300.0000 mg | ORAL_CAPSULE | ORAL | Status: AC
  Administered 2022-11-30: 300 mg via ORAL

## 2022-11-30 MED ORDER — CHLORHEXIDINE GLUCONATE CLOTH 2 % EX PADS: 6.0000 | MEDICATED_PAD | Freq: Once | CUTANEOUS | Status: DC

## 2022-11-30 MED ORDER — MIDAZOLAM HCL 2 MG/2ML IJ SOLN
INTRAMUSCULAR | Status: DC | PRN
  Administered 2022-11-30: 2 mg via INTRAVENOUS

## 2022-11-30 MED ORDER — ONDANSETRON HCL 4 MG/2ML IJ SOLN
INTRAMUSCULAR | Status: DC | PRN
  Administered 2022-11-30: 4 mg via INTRAVENOUS

## 2022-11-30 MED ORDER — ESCITALOPRAM OXALATE 20 MG PO TABS
20.0000 mg | ORAL_TABLET | Freq: Every day | ORAL | Status: DC
  Administered 2022-12-01: 20 mg via ORAL
  Filled 2022-11-30: qty 1

## 2022-11-30 MED ORDER — MEPERIDINE HCL 25 MG/ML IJ SOLN: 6.2500 mg | INTRAMUSCULAR | Status: DC | PRN

## 2022-11-30 MED ORDER — 0.9 % SODIUM CHLORIDE (POUR BTL) OPTIME
TOPICAL | Status: DC | PRN
  Administered 2022-11-30: 1000 mL

## 2022-11-30 MED ORDER — SODIUM CHLORIDE 0.9% FLUSH
3.0000 mL | Freq: Two times a day (BID) | INTRAVENOUS | Status: DC
  Administered 2022-11-30 (×2): 3 mL via INTRAVENOUS

## 2022-11-30 MED ORDER — METHOCARBAMOL 500 MG PO TABS
500.0000 mg | ORAL_TABLET | Freq: Four times a day (QID) | ORAL | Status: DC | PRN
  Administered 2022-11-30 – 2022-12-01 (×4): 500 mg via ORAL
  Filled 2022-11-30 (×4): qty 1

## 2022-11-30 MED ORDER — HYDROMORPHONE HCL 1 MG/ML IJ SOLN: 0.5000 mg | INTRAMUSCULAR | Status: DC | PRN

## 2022-11-30 MED ORDER — SUGAMMADEX SODIUM 200 MG/2ML IV SOLN
INTRAVENOUS | Status: DC | PRN
  Administered 2022-11-30: 381.2 mg via INTRAVENOUS

## 2022-11-30 MED ORDER — PREGABALIN 100 MG PO CAPS
200.0000 mg | ORAL_CAPSULE | Freq: Three times a day (TID) | ORAL | Status: DC
  Administered 2022-11-30 – 2022-12-01 (×3): 200 mg via ORAL
  Filled 2022-11-30 (×3): qty 2

## 2022-11-30 MED ORDER — THROMBIN 20000 UNITS EX SOLR: CUTANEOUS | Status: DC | PRN

## 2022-11-30 MED ORDER — PHENYLEPHRINE HCL-NACL 20-0.9 MG/250ML-% IV SOLN
INTRAVENOUS | Status: DC | PRN
  Administered 2022-11-30: 10 ug/min via INTRAVENOUS

## 2022-11-30 MED ORDER — ROCURONIUM BROMIDE 10 MG/ML (PF) SYRINGE
PREFILLED_SYRINGE | INTRAVENOUS | Status: DC | PRN
  Administered 2022-11-30: 30 mg via INTRAVENOUS
  Administered 2022-11-30: 70 mg via INTRAVENOUS

## 2022-11-30 MED ORDER — FENTANYL CITRATE (PF) 250 MCG/5ML IJ SOLN
INTRAMUSCULAR | Status: AC
  Filled 2022-11-30: qty 5

## 2022-11-30 MED ORDER — POTASSIUM CHLORIDE IN NACL 20-0.9 MEQ/L-% IV SOLN: INTRAVENOUS | Status: DC

## 2022-11-30 MED ORDER — LACTATED RINGERS IV SOLN: INTRAVENOUS | Status: DC

## 2022-11-30 MED ORDER — HYDROMORPHONE HCL 1 MG/ML IJ SOLN: 0.2500 mg | INTRAMUSCULAR | Status: DC | PRN

## 2022-11-30 MED ORDER — ONDANSETRON HCL 4 MG PO TABS: 4.0000 mg | ORAL_TABLET | Freq: Four times a day (QID) | ORAL | Status: DC | PRN

## 2022-11-30 MED ORDER — ZOLPIDEM TARTRATE 5 MG PO TABS
10.0000 mg | ORAL_TABLET | Freq: Every day | ORAL | Status: DC
  Administered 2022-11-30: 10 mg via ORAL
  Filled 2022-11-30: qty 2

## 2022-11-30 MED ORDER — ACETAMINOPHEN 500 MG PO TABS
1000.0000 mg | ORAL_TABLET | Freq: Four times a day (QID) | ORAL | Status: AC
  Administered 2022-11-30 – 2022-12-01 (×4): 1000 mg via ORAL
  Filled 2022-11-30 (×4): qty 2

## 2022-11-30 MED ORDER — PROPOFOL 500 MG/50ML IV EMUL
INTRAVENOUS | Status: DC | PRN
  Administered 2022-11-30: 50 ug/kg/min via INTRAVENOUS

## 2022-11-30 MED ORDER — CHLORHEXIDINE GLUCONATE 0.12 % MT SOLN
15.0000 mL | Freq: Once | OROMUCOSAL | Status: AC
  Administered 2022-11-30: 15 mL via OROMUCOSAL
  Filled 2022-11-30: qty 15

## 2022-11-30 MED ORDER — MIDAZOLAM HCL 2 MG/2ML IJ SOLN
INTRAMUSCULAR | Status: AC
  Filled 2022-11-30: qty 2

## 2022-11-30 MED ORDER — GABAPENTIN 300 MG PO CAPS
300.0000 mg | ORAL_CAPSULE | Freq: Once | ORAL | Status: DC
  Filled 2022-11-30: qty 1

## 2022-11-30 MED ORDER — PHENOL 1.4 % MT LIQD: 1.0000 | OROMUCOSAL | Status: DC | PRN

## 2022-11-30 MED ORDER — SODIUM CHLORIDE 0.9% FLUSH: 3.0000 mL | INTRAVENOUS | Status: DC | PRN

## 2022-11-30 MED ORDER — BUPIVACAINE HCL (PF) 0.25 % IJ SOLN
INTRAMUSCULAR | Status: DC | PRN
  Administered 2022-11-30: 20 mL

## 2022-11-30 MED ORDER — PROMETHAZINE HCL 25 MG/ML IJ SOLN: 6.2500 mg | INTRAMUSCULAR | Status: DC | PRN

## 2022-11-30 MED ORDER — THROMBIN 20000 UNITS EX SOLR
CUTANEOUS | Status: AC
  Filled 2022-11-30: qty 20000

## 2022-11-30 MED ORDER — SODIUM CHLORIDE 0.9 % IV SOLN
250.0000 mL | INTRAVENOUS | Status: DC
  Administered 2022-11-30: 250 mL via INTRAVENOUS

## 2022-11-30 MED ORDER — MENTHOL 3 MG MT LOZG: 1.0000 | LOZENGE | OROMUCOSAL | Status: DC | PRN

## 2022-11-30 MED ORDER — CEFAZOLIN SODIUM-DEXTROSE 2-4 GM/100ML-% IV SOLN
2.0000 g | Freq: Three times a day (TID) | INTRAVENOUS | Status: AC
  Administered 2022-11-30 (×2): 2 g via INTRAVENOUS
  Filled 2022-11-30 (×2): qty 100

## 2022-11-30 MED ORDER — BUPIVACAINE HCL (PF) 0.25 % IJ SOLN
INTRAMUSCULAR | Status: AC
  Filled 2022-11-30: qty 30

## 2022-11-30 MED ORDER — CELECOXIB 200 MG PO CAPS
200.0000 mg | ORAL_CAPSULE | Freq: Two times a day (BID) | ORAL | Status: DC
  Administered 2022-11-30 – 2022-12-01 (×3): 200 mg via ORAL
  Filled 2022-11-30 (×3): qty 1

## 2022-11-30 MED ORDER — FENTANYL CITRATE (PF) 250 MCG/5ML IJ SOLN
INTRAMUSCULAR | Status: DC | PRN
  Administered 2022-11-30: 50 ug via INTRAVENOUS
  Administered 2022-11-30: 100 ug via INTRAVENOUS
  Administered 2022-11-30: 50 ug via INTRAVENOUS

## 2022-11-30 MED ORDER — THROMBIN 5000 UNITS EX SOLR
CUTANEOUS | Status: AC
  Filled 2022-11-30: qty 5000

## 2022-11-30 MED ORDER — ORAL CARE MOUTH RINSE: 15.0000 mL | Freq: Once | OROMUCOSAL | Status: AC

## 2022-11-30 MED ORDER — ACETAMINOPHEN 500 MG PO TABS
1000.0000 mg | ORAL_TABLET | Freq: Once | ORAL | Status: DC
  Filled 2022-11-30: qty 2

## 2022-11-30 MED ORDER — PROPOFOL 10 MG/ML IV BOLUS
INTRAVENOUS | Status: DC | PRN
  Administered 2022-11-30: 200 mg via INTRAVENOUS

## 2022-11-30 MED ORDER — OXYCODONE HCL 5 MG PO TABS
10.0000 mg | ORAL_TABLET | ORAL | Status: DC | PRN
  Administered 2022-11-30 – 2022-12-01 (×6): 10 mg via ORAL
  Filled 2022-11-30 (×6): qty 2

## 2022-11-30 MED ORDER — METHOCARBAMOL 1000 MG/10ML IJ SOLN
500.0000 mg | Freq: Four times a day (QID) | INTRAVENOUS | Status: DC | PRN
  Filled 2022-11-30: qty 5

## 2022-11-30 MED ORDER — LIDOCAINE 2% (20 MG/ML) 5 ML SYRINGE
INTRAMUSCULAR | Status: DC | PRN
  Administered 2022-11-30: 100 mg via INTRAVENOUS

## 2022-11-30 MED ORDER — ONDANSETRON HCL 4 MG/2ML IJ SOLN: 4.0000 mg | Freq: Four times a day (QID) | INTRAMUSCULAR | Status: DC | PRN

## 2022-11-30 MED ORDER — CEFAZOLIN SODIUM-DEXTROSE 2-4 GM/100ML-% IV SOLN
2.0000 g | INTRAVENOUS | Status: AC
  Administered 2022-11-30: 2 g via INTRAVENOUS
  Filled 2022-11-30: qty 100

## 2022-11-30 MED ORDER — THROMBIN 5000 UNITS EX SOLR: OROMUCOSAL | Status: DC | PRN

## 2022-11-30 MED ORDER — DEXAMETHASONE SODIUM PHOSPHATE 10 MG/ML IJ SOLN
INTRAMUSCULAR | Status: DC | PRN
  Administered 2022-11-30: 10 mg via INTRAVENOUS

## 2022-11-30 MED ORDER — SENNA 8.6 MG PO TABS
1.0000 | ORAL_TABLET | Freq: Two times a day (BID) | ORAL | Status: DC
  Administered 2022-11-30 – 2022-12-01 (×3): 8.6 mg via ORAL
  Filled 2022-11-30 (×3): qty 1

## 2022-11-30 SURGICAL SUPPLY — 58 items
BAG COUNTER SPONGE SURGICOUNT (BAG) ×1 IMPLANT
BASKET BONE COLLECTION (BASKET) ×1 IMPLANT
BENZOIN TINCTURE PRP APPL 2/3 (GAUZE/BANDAGES/DRESSINGS) ×1 IMPLANT
BLADE BONE MILL MEDIUM (MISCELLANEOUS) ×1 IMPLANT
BLADE CLIPPER SURG (BLADE) IMPLANT
BUR CARBIDE MATCH 3.0 (BURR) ×1 IMPLANT
CANISTER SUCT 3000ML PPV (MISCELLANEOUS) ×1 IMPLANT
CNTNR URN SCR LID CUP LEK RST (MISCELLANEOUS) ×1 IMPLANT
CONT SPEC 4OZ STRL OR WHT (MISCELLANEOUS) ×1
COVER BACK TABLE 60X90IN (DRAPES) ×1 IMPLANT
DERMABOND ADVANCED .7 DNX12 (GAUZE/BANDAGES/DRESSINGS) ×1 IMPLANT
DRAPE C-ARM 42X72 X-RAY (DRAPES) ×2 IMPLANT
DRAPE LAPAROTOMY 100X72X124 (DRAPES) ×1 IMPLANT
DRAPE SURG 17X23 STRL (DRAPES) ×1 IMPLANT
DURAPREP 26ML APPLICATOR (WOUND CARE) ×1 IMPLANT
ELECT REM PT RETURN 9FT ADLT (ELECTROSURGICAL) ×1
ELECTRODE REM PT RTRN 9FT ADLT (ELECTROSURGICAL) ×1 IMPLANT
EVACUATOR 1/8 PVC DRAIN (DRAIN) ×1 IMPLANT
GAUZE 4X4 16PLY ~~LOC~~+RFID DBL (SPONGE) IMPLANT
GLOVE BIO SURGEON STRL SZ7 (GLOVE) IMPLANT
GLOVE BIO SURGEON STRL SZ8 (GLOVE) ×2 IMPLANT
GLOVE BIOGEL PI IND STRL 7.0 (GLOVE) IMPLANT
GOWN STRL REUS W/ TWL LRG LVL3 (GOWN DISPOSABLE) IMPLANT
GOWN STRL REUS W/ TWL XL LVL3 (GOWN DISPOSABLE) ×2 IMPLANT
GOWN STRL REUS W/TWL 2XL LVL3 (GOWN DISPOSABLE) IMPLANT
GOWN STRL REUS W/TWL LRG LVL3 (GOWN DISPOSABLE)
GOWN STRL REUS W/TWL XL LVL3 (GOWN DISPOSABLE) ×2
GRAFT BONE PROTEIOS LRG 5CC (Orthopedic Implant) IMPLANT
HEMOSTAT POWDER KIT SURGIFOAM (HEMOSTASIS) ×1 IMPLANT
KIT BASIN OR (CUSTOM PROCEDURE TRAY) ×1 IMPLANT
KIT GRAFTMAG DEL NEURO DISP (NEUROSURGERY SUPPLIES) IMPLANT
KIT TURNOVER KIT B (KITS) ×1 IMPLANT
MATRIX SPINE STRIP NEOCORE 5CC (Putty) IMPLANT
MILL BONE PREP (MISCELLANEOUS) ×1 IMPLANT
NDL HYPO 25X1 1.5 SAFETY (NEEDLE) ×1 IMPLANT
NEEDLE HYPO 25X1 1.5 SAFETY (NEEDLE) ×1 IMPLANT
NS IRRIG 1000ML POUR BTL (IV SOLUTION) ×1 IMPLANT
PACK LAMINECTOMY NEURO (CUSTOM PROCEDURE TRAY) ×1 IMPLANT
PAD ARMBOARD 7.5X6 YLW CONV (MISCELLANEOUS) ×3 IMPLANT
ROD LORD LIPPED TI 5.5X35 (Rod) IMPLANT
SCREW CORT SHANK MOD 6.5X40 (Screw) IMPLANT
SCREW KODIAK 6.5X45 (Screw) IMPLANT
SCREW POLYAXIAL TULIP (Screw) IMPLANT
SET SCREW (Screw) ×4 IMPLANT
SET SCREW SPNE (Screw) IMPLANT
SPACER IDENTITI PS 9X9X25 10D (Spacer) IMPLANT
SPONGE SURGIFOAM ABS GEL 100 (HEMOSTASIS) ×1 IMPLANT
SPONGE T-LAP 4X18 ~~LOC~~+RFID (SPONGE) IMPLANT
STRIP CLOSURE SKIN 1/2X4 (GAUZE/BANDAGES/DRESSINGS) ×2 IMPLANT
STRIP MATRIX NEOCORE 5CC (Putty) ×1 IMPLANT
SUT VIC AB 0 CT1 18XCR BRD8 (SUTURE) ×1 IMPLANT
SUT VIC AB 0 CT1 8-18 (SUTURE) ×1
SUT VIC AB 2-0 CP2 18 (SUTURE) ×1 IMPLANT
SUT VIC AB 3-0 SH 8-18 (SUTURE) ×2 IMPLANT
TOWEL GREEN STERILE (TOWEL DISPOSABLE) ×1 IMPLANT
TOWEL GREEN STERILE FF (TOWEL DISPOSABLE) ×1 IMPLANT
TRAY FOLEY MTR SLVR 16FR STAT (SET/KITS/TRAYS/PACK) ×1 IMPLANT
WATER STERILE IRR 1000ML POUR (IV SOLUTION) ×1 IMPLANT

## 2022-11-30 NOTE — Op Note (Signed)
11/30/2022  10:07 AM  PATIENT:  Jerome Robinson  51 y.o. 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 Decompressive lumbar laminectomy, hemi facetectomy and foraminotomies L4-5 requiring more work than would be required for a simple exposure of the disk for PLIF in order to adequately decompress the neural elements and address the spinal stenosis 2. Posterior lumbar interbody fusion L4-5 using PTI interbody cages packed with morcellized allograft and autograft  3. Posterior fixation L4-5 using ATEC cortical pedicle screws.  4. Intertransverse arthrodesis L4-5 using morcellized autograft and allograft.  SURGEON:  Sherley Bounds, MD  ASSISTANTS: Glenford Peers FNP  ANESTHESIA:  General  EBL: 150 ml  Total I/O In: 1000 [I.V.:1000] Out: 400 [Urine:250; Blood:150]  BLOOD ADMINISTERED:none  DRAINS: none   INDICATION FOR PROCEDURE: This patient presented with back pain and leg pain.  He had a previous microdiscectomy at L4-5 on the left in the remote past. imaging revealed degenerative disc disease and spondylosis and recurrent disc herniation.  He has been in pain management with discogenic back pain for years.  The patient tried a reasonable attempt at conservative medical measures without relief. I recommended decompression and instrumented fusion to address the stenosis as well as the segmental  instability.  Patient understood the risks, benefits, and alternatives and potential outcomes and wished to proceed.  PROCEDURE DETAILS:  The patient was brought to the operating room. After induction of generalized endotracheal anesthesia the patient was rolled into the prone position on chest rolls and all pressure points were padded. The patient's lumbar region was cleaned and then prepped with DuraPrep and draped in the usual sterile  fashion. Anesthesia was injected and then a dorsal midline incision was made and carried down to the lumbosacral fascia. The fascia was opened and the paraspinous musculature was taken down in a subperiosteal fashion to expose L4-5. A self-retaining retractor was placed. Intraoperative fluoroscopy confirmed my level, and I started with placement of the L4 cortical pedicle screws. The pedicle screw entry zones were identified utilizing surface landmarks and  AP and lateral fluoroscopy. I scored the cortex with the high-speed drill and then used the hand drill to drill an upward and outward direction into the pedicle. I then tapped line to line. I then placed a 6.5 x 40 mm cortical pedicle screw into the pedicles of L4 bilaterally.    I then turned my attention to the decompression and complete lumbar laminectomies, hemi- facetectomies, and foraminotomies were performed at L4-5.  My nurse practitioner was directly involved in the decompression and exposure of the neural elements. the patient had significant spinal stenosis and this required more work than would be required for a simple exposure of the disc for posterior lumbar interbody fusion which would only require a limited laminotomy. Much more generous decompression and generous foraminotomy was undertaken in order to adequately decompress the neural elements and address the patient's leg pain. The yellow ligament was removed to expose the underlying dura and nerve roots, and generous foraminotomies were performed to adequately decompress the neural elements. Both the exiting and traversing nerve roots were decompressed on both sides until a coronary dilator passed easily along the nerve roots. Once the decompression was complete, I turned my attention to the posterior lower lumbar interbody fusion. The epidural venous vasculature was coagulated and cut sharply. Disc space was incised and the initial discectomy was  performed with pituitary rongeurs. The disc  space was distracted with sequential distractors to a height of 9 mm. We then used a series of scrapers and shavers to prepare the endplates for fusion. The midline was prepared with Epstein curettes. Once the complete discectomy was finished, we packed an appropriate sized interbody cage with local autograft and morcellized allograft, gently retracted the nerve root, and tapped the cage into position at L4-5.  The midline between the cages was packed with morselized autograft and allograft.   We then turned our attention to the placement of the lower pedicle screws. The pedicle screw entry zones were identified utilizing surface landmarks and fluoroscopy. I drilled into each pedicle utilizing the hand drill, and tapped each pedicle with the appropriate tap. We palpated with a ball probe to assure no break in the cortex. We then placed 6.5 x 45 mm pedicle screws into the pedicles bilaterally at L5.  My nurse practitioner assisted in placement of the pedicle screws.  We then decorticated the transverse processes and laid a mixture of morcellized autograft and allograft out over these to perform intertransverse arthrodesis at L4-5. We then placed lordotic rods into the multiaxial screw heads of the pedicle screws and locked these in position with the locking caps and anti-torque device while achieving compression of our grafts. We then checked our construct with AP and lateral fluoroscopy. Irrigated with copious amounts of bacitracin-containing saline solution. Inspected the nerve roots once again to assure adequate decompression, lined to the dura with Gelfoam,  and then we closed the muscle and the fascia with 0 Vicryl. Closed the subcutaneous tissues with 2-0 Vicryl and subcuticular tissues with 3-0 Vicryl. The skin was closed with benzoin and Steri-Strips. Dressing was then applied, the patient was awakened from general anesthesia and transported to the recovery room in stable condition. At the end of the  procedure all sponge, needle and instrument counts were correct.   PLAN OF CARE: admit to inpatient  PATIENT DISPOSITION:  PACU - hemodynamically stable.   Delay start of Pharmacological VTE agent (>24hrs) due to surgical blood loss or risk of bleeding:  yes

## 2022-11-30 NOTE — Anesthesia Procedure Notes (Signed)
Procedure Name: Intubation Date/Time: 11/30/2022 7:45 AM  Performed by: Minerva Ends, CRNAPre-anesthesia Checklist: Patient identified, Emergency Drugs available, Suction available and Patient being monitored Patient Re-evaluated:Patient Re-evaluated prior to induction Oxygen Delivery Method: Circle system utilized Preoxygenation: Pre-oxygenation with 100% oxygen Induction Type: IV induction Ventilation: Mask ventilation without difficulty Laryngoscope Size: Mac and 3 Grade View: Grade I Tube type: Oral Tube size: 7.0 mm Number of attempts: 1 Airway Equipment and Method: Stylet and Oral airway Placement Confirmation: ETT inserted through vocal cords under direct vision, positive ETCO2 and breath sounds checked- equal and bilateral Secured at: 23 cm Tube secured with: Tape Dental Injury: Teeth and Oropharynx as per pre-operative assessment

## 2022-11-30 NOTE — Transfer of Care (Signed)
Immediate Anesthesia Transfer of Care Note  Patient: Jerome Robinson  Procedure(s) Performed: PLIF - L4-L5 (Back)  Patient Location: PACU  Anesthesia Type:General  Level of Consciousness: awake, alert , and oriented  Airway & Oxygen Therapy: Patient Spontanous Breathing  Post-op Assessment: Report given to RN and Post -op Vital signs reviewed and stable  Post vital signs: Reviewed and stable  Last Vitals:  Vitals Value Taken Time  BP 156/84 11/30/22 1018  Temp    Pulse 75 11/30/22 1019  Resp 23 11/30/22 1019  SpO2 91 % 11/30/22 1019  Vitals shown include unvalidated device data.  Last Pain:  Vitals:   11/30/22 0650  TempSrc:   PainSc: 3       Patients Stated Pain Goal: 0 (64/31/42 7670)  Complications: No notable events documented.

## 2022-11-30 NOTE — H&P (Signed)
Subjective: Patient is a 51 y.o. male admitted for plif L4-5. Onset of symptoms was several years ago, gradually worsening since that time.  The pain is rated severe, and is located at the across the lower back and radiates to legs. The pain is described as aching and occurs all day. The symptoms have been progressive. Symptoms are exacerbated by exercise, standing, and walking for more than a few minutes. MRI or CT showed DDDwith spondylosis and previous surgery L4-5   Past Medical History:  Diagnosis Date   Allergy    Back pain    History of kidney stones     Past Surgical History:  Procedure Laterality Date   BACK SURGERY  2015   COLONOSCOPY  08/01/2013   Dr.Perry   NASAL SINUS SURGERY      Prior to Admission medications   Medication Sig Start Date End Date Taking? Authorizing Provider  escitalopram (LEXAPRO) 20 MG tablet Take 20 mg by mouth daily. 01/25/22  Yes [provider]  Multiple Vitamins-Minerals (MULTIVITAMIN PO) Take 1 tablet by mouth daily.   Yes [provider]  naltrexone (DEPADE) 50 MG tablet Take 50 mg by mouth daily as needed (alcohol use). 10/21/22  Yes [provider]  pregabalin (LYRICA) 200 MG capsule Take 200 mg by mouth 3 (three) times daily. 12/29/21  Yes [provider]  zolpidem (AMBIEN) 10 MG tablet Take 10 mg by mouth at bedtime.   Yes [provider]  fluticasone (FLONASE) 50 MCG/ACT nasal spray Place 1 spray into both nostrils daily as needed for allergies or rhinitis.    [provider]  methocarbamol (ROBAXIN) 500 MG tablet Take 500 mg by mouth 3 (three) times daily as needed for muscle spasms. 10/29/22   [provider]  oxyCODONE-acetaminophen (PERCOCET/ROXICET) 5-325 MG tablet Take 1 tablet by mouth every 4 (four) hours as needed.    [provider]   Allergies  Allergen Reactions   Prednisone Itching    Social History   Tobacco Use   Smoking status: Some Days    Types: Cigars    Smokeless tobacco: Current    Types: Chew  Substance Use Topics   Alcohol use: Yes    Alcohol/week: 12.0 standard drinks of alcohol    Types: 12 Cans of beer per week    Family History  Problem Relation Age of Onset   Colon cancer Mother 69   Esophageal cancer Neg Hx    Rectal cancer Neg Hx    Stomach cancer Neg Hx      Review of Systems  Positive ROS: neg  All other systems have been reviewed and were otherwise negative with the exception of those mentioned in the HPI and as above.  Objective: Vital signs in last 24 hours: Temp:  [98.3 F (36.8 C)] 98.3 F (36.8 C) (12/04 0618) Pulse Rate:  [57] 57 (12/04 0618) Resp:  [18] 18 (12/04 0618) BP: (147)/(87) 147/87 (12/04 0618) SpO2:  [98 %] 98 % (12/04 0618) Weight:  [95.3 kg] 95.3 kg (12/04 0618)  General Appearance: Alert, cooperative, no distress, appears stated age Head: Normocephalic, without obvious abnormality, atraumatic Eyes: PERRL, conjunctiva/corneas clear, EOM's intact    Neck: Supple, symmetrical, trachea midline Back: Symmetric, no curvature, ROM normal, no CVA tenderness Lungs:  respirations unlabored Heart: Regular rate and rhythm Abdomen: Soft, non-tender Extremities: Extremities normal, atraumatic, no cyanosis or edema Pulses: 2+ and symmetric all extremities Skin: Skin color, texture, turgor normal, no rashes or lesions  NEUROLOGIC:  Mental status: Alert and oriented x4,  no aphasia, good attention span, fund of knowledge, and memory Motor Exam - grossly normal Sensory Exam - grossly normal Reflexes: 1= Coordination - grossly normal Gait - grossly normal Balance - grossly normal Cranial Nerves: I: smell Not tested  II: visual acuity  OS: nl    OD: nl  II: visual fields Full to confrontation  II: pupils Equal, round, reactive to light  III,VII: ptosis None  III,IV,VI: extraocular muscles  Full ROM  V: mastication Normal  V: facial light touch sensation  Normal  V,VII: corneal reflex   Present  VII: facial muscle function - upper  Normal  VII: facial muscle function - lower Normal  VIII: hearing Not tested  IX: soft palate elevation  Normal  IX,X: gag reflex Present  XI: trapezius strength  5/5  XI: sternocleidomastoid strength 5/5  XI: neck flexion strength  5/5  XII: tongue strength  Normal    Data Review Lab Results  Component Value Date   WBC 7.7 11/18/2022   HGB 16.7 11/18/2022   HCT 48.3 11/18/2022   MCV 91.5 11/18/2022   PLT 236 11/18/2022   Lab Results  Component Value Date   NA 139 11/18/2022   K 4.4 11/18/2022   CL 104 11/18/2022   CO2 22 11/18/2022   BUN 15 11/18/2022   CREATININE 0.94 11/18/2022   GLUCOSE 108 (H) 11/18/2022   No results found for: "INR", "PROTIME"  Assessment/Plan:  Estimated body mass index is 30.13 kg/m as calculated from the following:   Height as of this encounter: '5\' 10"'$  (1.778 m).   Weight as of this encounter: 95.3 kg. Patient admitted for PLIF L4-5. Patient has failed a reasonable attempt at conservative therapy.  I explained the condition and procedure to the patient and answered any questions.  Patient wishes to proceed with procedure as planned. Understands risks/ benefits and typical outcomes of procedure.   Jerome Robinson 11/30/2022 7:19 AM

## 2022-12-01 DIAGNOSIS — M47816 Spondylosis without myelopathy or radiculopathy, lumbar region: Secondary | ICD-10-CM | POA: Diagnosis not present

## 2022-12-01 NOTE — Discharge Summary (Signed)
Physician Discharge Summary  Patient ID: Jerome Robinson MRN: [id number] DOB/AGE: [birth year] 51 y.o.  Admit date: 11/30/2022 Discharge date: 12/01/2022  Admission Diagnoses: Failed back syndrome, degenerative disc disease, back and leg pain    Discharge Diagnoses: Same   Discharged Condition: good  Hospital Course: The patient was admitted on 11/30/2022 and taken to the operating room where the patient underwent posterior lumbar interbody fusion L4-5. The patient tolerated the procedure well and was taken to the recovery room and then to the floor in stable condition. The hospital course was routine. There were no complications. The wound remained clean dry and intact. Pt had appropriate back soreness. No complaints of leg pain or new N/T/W. The patient remained afebrile with stable vital signs, and tolerated a regular diet. The patient continued to increase activities, and pain was well controlled with oral pain medications.   Consults: None  Significant Diagnostic Studies:  Results for orders placed or performed during the hospital encounter of 11/30/22  ABO/Rh  Result Value Ref Range   ABO/RH(D)      O POS Performed at [facility] [address], [place], [place] [address]     DG Lumbar Spine 2-3 Views  Result Date: 11/30/2022 CLINICAL DATA:  Status post L4-5 PLIF. EXAM: LUMBAR SPINE - 2-3 VIEW COMPARISON:  CT lumbar spine 08/07/2021 FINDINGS: Three images obtained portably in the operating room via C-arm radiography show placement of bilateral pedicle screws and rods at L4-5 with interbody spacer placed in the L4-5 disc space. The alignment appears anatomic. IMPRESSION: Status post L4-5 PLIF. Electronically Signed   By: Kerby Moors M.D.   On: 11/30/2022 10:05   DG C-Arm 1-60 Min-No Report  Result Date: 11/30/2022 Fluoroscopy was utilized by the requesting physician.  No radiographic interpretation.   DG C-Arm 1-60 Min-No Report  Result Date: 11/30/2022 Fluoroscopy was  utilized by the requesting physician.  No radiographic interpretation.   DG C-Arm 1-60 Min-No Report  Result Date: 11/30/2022 Fluoroscopy was utilized by the requesting physician.  No radiographic interpretation.    Antibiotics:  Anti-infectives (From admission, onward)    Start     Dose/Rate Route Frequency Ordered Stop   11/30/22 1200  ceFAZolin (ANCEF) IVPB 2g/100 mL premix        2 g 200 mL/hr over 30 Minutes Intravenous Every 8 hours 11/30/22 1107 11/30/22 1918   11/30/22 0600  ceFAZolin (ANCEF) IVPB 2g/100 mL premix        2 g 200 mL/hr over 30 Minutes Intravenous On call to O.R. 11/30/22 0554 11/30/22 0756       Discharge Exam: Blood pressure 130/71, pulse (!) 55, temperature 97.9 F (36.6 C), temperature source Oral, resp. rate 20, height '5\' 10"'$  (1.778 m), weight 95.3 kg, SpO2 99 %. Neurologic: Grossly normal Dressing clean dry and intact  Discharge Medications:   Allergies as of 12/01/2022       Reactions   Prednisone Itching        Medication List     TAKE these medications    escitalopram 20 MG tablet Commonly known as: LEXAPRO Take 20 mg by mouth daily.   fluticasone 50 MCG/ACT nasal spray Commonly known as: FLONASE Place 1 spray into both nostrils daily as needed for allergies or rhinitis.   methocarbamol 500 MG tablet Commonly known as: ROBAXIN Take 500 mg by mouth 3 (three) times daily as needed for muscle spasms.   MULTIVITAMIN PO Take 1 tablet by mouth daily.   naltrexone 50 MG  tablet Commonly known as: DEPADE Take 50 mg by mouth daily as needed (alcohol use).   oxyCODONE-acetaminophen 5-325 MG tablet Commonly known as: PERCOCET/ROXICET Take 1 tablet by mouth every 4 (four) hours as needed.   pregabalin 200 MG capsule Commonly known as: LYRICA Take 200 mg by mouth 3 (three) times daily.   zolpidem 10 MG tablet Commonly known as: AMBIEN Take 10 mg by mouth at bedtime.               Durable Medical Equipment  (From  admission, onward)           Start     Ordered   11/30/22 1108  DME Walker rolling  Once       Question:  Patient needs a walker to treat with the following condition  Answer:  S/P lumbar fusion   11/30/22 1107   11/30/22 1108  DME 3 n 1  Once        11/30/22 1107            Disposition: home   Final Dx: PLIF L4-5  Discharge Instructions      Remove dressing in 72 hours   Complete by: As directed    Call MD for:  difficulty breathing, headache or visual disturbances   Complete by: As directed    Call MD for:  persistant nausea and vomiting   Complete by: As directed    Call MD for:  redness, tenderness, or signs of infection (pain, swelling, redness, odor or green/yellow discharge around incision site)   Complete by: As directed    Call MD for:  severe uncontrolled pain   Complete by: As directed    Call MD for:  temperature >100.4   Complete by: As directed    Diet - low sodium heart healthy   Complete by: As directed    Discharge instructions   Complete by: As directed    No driving, no heavy lifting, may shower   Increase activity slowly   Complete by: As directed           Signed: [name] 12/01/2022, 7:45 AM

## 2022-12-01 NOTE — Evaluation (Signed)
Physical Therapy Evaluation Patient Details Name: Jerome Robinson MRN: [id number] DOB: 1971-09-13 Today's Date: 12/01/2022  History of Present Illness  Pt is a 51yo male who underwent PLIF L4-5 on 12/4 for degenerative disc disease and radiating pain down L LE. PSH: previous back surgery in 2015.   Clinical Impression  Pt admitted with above. Pt aware and compliant of all back precautions. Discussed isometric abdominal exercises to start building core strength. Instructed pt to wait on planks and other core exercises until cleared by Dr. Ronnald Ramp. Pt functioning at modified independent level. Pt with good home set up and support. Pt with no further acute PT needs at this time. PT signing off. Please re-consult if needed in future.       Recommendations for follow up therapy are one component of a multi-disciplinary discharge planning process, led by the attending physician.  Recommendations may be updated based on patient status, additional functional criteria and insurance authorization.  Follow Up Recommendations No PT follow up      Assistance Recommended at Discharge Intermittent Supervision/Assistance  Patient can return home with the following       Equipment Recommendations None recommended by PT  Recommendations for Other Services       Functional Status Assessment Patient has had a recent decline in their functional status and demonstrates the ability to make significant improvements in function in a reasonable and predictable amount of time.     Precautions / Restrictions Precautions Precautions: Back Precaution Booklet Issued: Yes (comment) Precaution Comments: pt with good understanding Required Braces or Orthoses: Spinal Brace Spinal Brace: Lumbar corset Restrictions Weight Bearing Restrictions: No      Mobility  Bed Mobility Overal bed mobility: Modified Independent             General bed mobility comments: used log roll technique    Transfers Overall  transfer level: Modified independent Equipment used: None Transfers: Sit to/from Stand Sit to Stand: Modified independent (Device/Increase time)           General transfer comment: pt with wide base of support, minimal trunk flexion, guarded but steady    Ambulation/Gait Ambulation/Gait assistance: Modified independent (Device/Increase time) Gait Distance (Feet): 200 Feet Assistive device: None Gait Pattern/deviations: WFL(Within Functional Limits) Gait velocity: wfl Gait velocity interpretation: 1.31 - 2.62 ft/sec, indicative of limited community ambulator   General Gait Details: pt guarded but able to maintain upright posture, discussed isometric abdominal contractions to help support back  Stairs Stairs: Yes Stairs assistance: Modified independent (Device/Increase time) Stair Management: One rail Left, Alternating pattern Number of Stairs: 12 General stair comments: no difficulty, discussed "up with the good, down with the bad" if pt experiences radiating pain down LE again  Wheelchair Mobility    Modified Rankin (Stroke Patients Only)       Balance Overall balance assessment: Modified Independent                                           Pertinent Vitals/Pain Pain Assessment Pain Assessment: 0-10 Pain Score: 3  Pain Location: soreness at back incision, denies radiating pain down L LE Pain Descriptors / Indicators: Sore Pain Intervention(s): Monitored during session    Home Living Family/patient expects to be discharged to:: Private residence Living Arrangements: Spouse/significant other Available Help at Discharge: Family;Available PRN/intermittently (can have 24/7 if he really needed it) Type of Home: House Home Access: Stairs  to enter Entrance Stairs-Rails: Left Entrance Stairs-Number of Steps: 3   Home Layout: One level Home Equipment: Adaptive equipment      Prior Function Prior Level of Function : Independent/Modified  Independent             Mobility Comments: works, drives ADLs Comments: modified himself to manage chronic back pain     Hand Dominance   Dominant Hand: Right    Extremity/Trunk Assessment   Upper Extremity Assessment Upper Extremity Assessment: Overall WFL for tasks assessed    Lower Extremity Assessment Lower Extremity Assessment: Overall WFL for tasks assessed    Cervical / Trunk Assessment Cervical / Trunk Assessment: Back Surgery  Communication   Communication: No difficulties  Cognition Arousal/Alertness: Awake/alert Behavior During Therapy: WFL for tasks assessed/performed Overall Cognitive Status: Within Functional Limits for tasks assessed                                 General Comments: pt asking appropriate questions, demo's good understanding of back precautions        General Comments General comments (skin integrity, edema, etc.): VSS, back incision not observed    Exercises     Assessment/Plan    PT Assessment Patient does not need any further PT services  PT Problem List         PT Treatment Interventions      PT Goals (Current goals can be found in the Care Plan section)  Acute Rehab PT Goals Patient Stated Goal: no more pain PT Goal Formulation: All assessment and education complete, DC therapy    Frequency       Co-evaluation               AM-PAC PT "6 Clicks" Mobility  Outcome Measure Help needed turning from your back to your side while in a flat bed without using bedrails?: None Help needed moving from lying on your back to sitting on the side of a flat bed without using bedrails?: None Help needed moving to and from a bed to a chair (including a wheelchair)?: None Help needed standing up from a chair using your arms (e.g., wheelchair or bedside chair)?: None Help needed to walk in hospital room?: None Help needed climbing 3-5 steps with a railing? : None 6 Click Score: 24    End of Session Equipment  Utilized During Treatment: Back brace Activity Tolerance: Patient tolerated treatment well Patient left: in bed;with call bell/phone within reach (sitting EOB) Nurse Communication: Mobility status PT Visit Diagnosis: Difficulty in walking, not elsewhere classified (R26.2)    Time: 0728-0800 PT Time Calculation (min) (ACUTE ONLY): 32 min   Charges:   PT Evaluation $PT Eval Moderate Complexity: 1 Mod PT Treatments $Gait Training: 8-22 mins        Kittie Plater, PT, DPT Acute Rehabilitation Services Secure chat preferred Office #: [phone]   Jerome Robinson 12/01/2022, 8:17 AM

## 2022-12-01 NOTE — Progress Notes (Signed)
Patient alert and oriented, mae's well, voiding adequate amount of urine, swallowing without difficulty, no c/o pain at time of discharge. Patient discharged home with family. Script and discharged instructions given to patient. Patient and family stated understanding of instructions given. Patient has an appointment with Dr. Jones in 2 weeks ?

## 2022-12-01 NOTE — Anesthesia Postprocedure Evaluation (Signed)
Anesthesia Post Note  Patient: Jerome Robinson  Procedure(s) Performed: PLIF - L4-L5 (Back)     Patient location during evaluation: PACU Anesthesia Type: General Level of consciousness: sedated and patient cooperative Pain management: pain level controlled Vital Signs Assessment: post-procedure vital signs reviewed and stable Respiratory status: spontaneous breathing Cardiovascular status: stable Anesthetic complications: no   No notable events documented.  Last Vitals:  Vitals:   12/01/22 0549 12/01/22 0804  BP: 130/71 128/78  Pulse: (!) 55 60  Resp: 20 18  Temp: 36.6 C 36.6 C  SpO2: 99% 98%    Last Pain:  Vitals:   12/01/22 0804  TempSrc: Oral  PainSc:                  Nolon Nations

## 2022-12-01 NOTE — Progress Notes (Signed)
OT Screen Note  Patient Details Name: Jerome Robinson MRN: [id number] DOB: 1971-08-26   Cancelled Treatment:    Reason Eval/Treat Not Completed: OT screened, no needs identified, will sign off ( communicated by PT Ashly)  [name] 12/01/2022, 10:03 AM

## 2022-12-03 ENCOUNTER — Encounter (HOSPITAL_COMMUNITY): Payer: Self-pay | Admitting: Neurological Surgery

## 2025-01-28 ENCOUNTER — Emergency Department (HOSPITAL_BASED_OUTPATIENT_CLINIC_OR_DEPARTMENT_OTHER)
Admission: EM | Admit: 2025-01-28 | Discharge: 2025-01-28 | Disposition: A | Discharge status: Home / Self Care | Attending: Emergency Medicine | Admitting: Emergency Medicine

## 2025-01-28 ENCOUNTER — Emergency Department (HOSPITAL_BASED_OUTPATIENT_CLINIC_OR_DEPARTMENT_OTHER)

## 2025-01-28 ENCOUNTER — Other Ambulatory Visit: Payer: Self-pay

## 2025-01-28 ENCOUNTER — Emergency Department (HOSPITAL_BASED_OUTPATIENT_CLINIC_OR_DEPARTMENT_OTHER): Admitting: Radiology

## 2025-01-28 DIAGNOSIS — M79605 Pain in left leg: Secondary | ICD-10-CM | POA: Diagnosis present

## 2025-01-28 DIAGNOSIS — W000XXA Fall on same level due to ice and snow, initial encounter: Secondary | ICD-10-CM | POA: Insufficient documentation

## 2025-01-28 DIAGNOSIS — S82842A Displaced bimalleolar fracture of left lower leg, initial encounter for closed fracture: Secondary | ICD-10-CM | POA: Insufficient documentation

## 2025-01-28 MED ORDER — OXYCODONE-ACETAMINOPHEN 5-325 MG PO TABS: 1.0000 | ORAL_TABLET | Freq: Four times a day (QID) | ORAL | 0 refills | Status: AC | PRN

## 2025-01-28 MED ORDER — PROPOFOL 10 MG/ML IV BOLUS
44.0000 mg | Freq: Once | INTRAVENOUS | Status: DC
  Filled 2025-01-28: qty 20

## 2025-01-28 MED ORDER — HYDROMORPHONE HCL 1 MG/ML IJ SOLN
1.0000 mg | Freq: Once | INTRAMUSCULAR | Status: AC
  Administered 2025-01-28: 1 mg via INTRAVENOUS
  Filled 2025-01-28: qty 1

## 2025-01-28 MED ORDER — KETOROLAC TROMETHAMINE 30 MG/ML IJ SOLN
30.0000 mg | Freq: Once | INTRAMUSCULAR | Status: AC
  Administered 2025-01-28: 30 mg via INTRAVENOUS
  Filled 2025-01-28: qty 1

## 2025-01-28 MED ORDER — IBUPROFEN 800 MG PO TABS: 800.0000 mg | ORAL_TABLET | Freq: Three times a day (TID) | ORAL | 0 refills | Status: AC

## 2025-01-28 MED ORDER — PROPOFOL 10 MG/ML IV BOLUS
INTRAVENOUS | Status: AC | PRN
  Administered 2025-01-28: 20 mg via INTRAVENOUS
  Administered 2025-01-28: 50 mg via INTRAVENOUS
  Administered 2025-01-28: 40 mg via INTRAVENOUS
  Administered 2025-01-28: 50 mg via INTRAVENOUS
  Administered 2025-01-28: 40 mg via INTRAVENOUS

## 2025-01-28 NOTE — ED Provider Notes (Signed)
 " [place] EMERGENCY DEPARTMENT AT [facility] Provider Note   CSN: [id number] Arrival date & time: 01/28/25  1440     Patient presents with: Jerome   Kingsten Robinson is a 54 y.o. male.   HPI Patient slipped and fell on the ice.  He severely twisted his left ankle.  He reports it is very painful and swollen.  Patient denies any other injury.  He denies he struck his head.  No headache no loss of consciousness.  No neck pain no weakness numbness tingling of extremities.  Patient denies any chest pain shortness of breath or difficulty breathing.  Patient does not take anticoagulants.  He reports that at baseline he is healthy without significant medical problems.  He does report he had a couple of back surgeries but they have been stable.    Prior to Admission medications  Medication Sig Start Date End Date Taking? Authorizing Provider  ibuprofen  (ADVIL ) 800 MG tablet Take 1 tablet (800 mg total) by mouth 3 (three) times daily. 01/28/25  Yes Armenta Canning, MD  oxyCODONE -acetaminophen  (PERCOCET) 5-325 MG tablet Take 1-2 tablets by mouth every 6 (six) hours as needed. 01/28/25  Yes Armenta Canning, MD  escitalopram  (LEXAPRO ) 20 MG tablet Take 20 mg by mouth daily. 01/25/22   [provider]  fluticasone (FLONASE) 50 MCG/ACT nasal spray Place 1 spray into both nostrils daily as needed for allergies or rhinitis.    [provider]  methocarbamol  (ROBAXIN ) 500 MG tablet Take 500 mg by mouth 3 (three) times daily as needed for muscle spasms. 10/29/22   [provider]  Multiple Vitamins-Minerals (MULTIVITAMIN PO) Take 1 tablet by mouth daily.    [provider]  naltrexone (DEPADE) 50 MG tablet Take 50 mg by mouth daily as needed (alcohol use). 10/21/22   [provider]  oxyCODONE -acetaminophen  (PERCOCET/ROXICET) 5-325 MG tablet Take 1 tablet by mouth every 4 (four) hours as needed.    [provider]  pregabalin  (LYRICA ) 200 MG capsule Take  200 mg by mouth 3 (three) times daily. 12/29/21   [provider]  zolpidem  (AMBIEN ) 10 MG tablet Take 10 mg by mouth at bedtime.    [provider]    Allergies: Prednisone    Review of Systems  Updated Vital Signs BP (!) 179/102   Pulse 89   Temp 98.3 F (36.8 C) (Oral)   Resp 15   Wt 88.5 kg   SpO2 96%   BMI 27.98 kg/m   Physical Exam Constitutional:      Comments: Alert nontoxic.  No respiratory distress.  Well-nourished well-developed.  HENT:     Head: Atraumatic.     Nose: Nose normal.     Mouth/Throat:     Mouth: Mucous membranes are moist.     Pharynx: Oropharynx is clear.     Comments: Dentition intact.  Posterior oropharynx widely visible. Eyes:     Extraocular Movements: Extraocular movements intact.     Conjunctiva/sclera: Conjunctivae normal.  Neck:     Comments: Normal range of motion.  No cervical spine tenderness. Cardiovascular:     Rate and Rhythm: Normal rate and regular rhythm.  Pulmonary:     Effort: Pulmonary effort is normal.     Breath sounds: Normal breath sounds.  Chest:     Chest wall: No tenderness.  Abdominal:     General: There is no distension.     Palpations: Abdomen is soft.     Tenderness: There is no abdominal tenderness.  There is no guarding.  Musculoskeletal:     Cervical back: Normal range of motion.     Comments: Patient has large swelling of the left ankle with ecchymosis formation.  The foot is warm and dry.  Distal pulse 1+.  Patient is neurovascularly intact.  He can move his toes.  Right lower extremity is normal.  Skin:    General: Skin is warm and dry.  Neurological:     General: No focal deficit present.     Mental Status: He is oriented to person, place, and time.     Motor: No weakness.     Coordination: Coordination normal.  Psychiatric:        Mood and Affect: Mood normal.     (all labs ordered are listed, but only abnormal results are displayed) Labs Reviewed - No data to  display  EKG: None  Radiology: DG Ankle Complete Left Result Date: 01/28/2025 CLINICAL DATA:  Postreduction. EXAM: LEFT ANKLE COMPLETE - 3+ VIEW COMPARISON:  01/28/2025. FINDINGS: There is a fracture of the lateral malleolus with mild residual posterior dislocation of the distal fracture fragment. There is a minimally displaced fracture of the posterior tibia. There is likely an avulsion fracture of the medial malleolus. No dislocation is seen. Soft tissue swelling is present about the ankle. There is minimal calcaneal spurring. Examination is limited due to overlying casting material. IMPRESSION: 1. Improved alignment of the tibiotalar joint. 2. Fractures of the lateral malleolus, medial malleolus, and posterior tibia. Electronically Signed   By: Leita Birmingham M.D.   On: 01/28/2025 18:08   DG Ankle Complete Left Result Date: 01/28/2025 CLINICAL DATA:  Substance fell, difficulty bearing weight EXAM: LEFT ANKLE COMPLETE - 3+ VIEW COMPARISON:  None Available. FINDINGS: Frontal, oblique, and lateral views of the left ankle are obtained. There is an oblique fracture through the distal left fibula, with approximately 5 mm of lateral translation of the distal fracture fragment. There is a small curvilinear ossific density adjacent to the tip of the medial malleolus consistent with avulsion fracture. No other acute bony abnormalities are observed. There is 5-6 mm of lateral subluxation of the talus relative to the tibial plafond and. No frank dislocation. Diffuse soft tissue swelling greatest anteriorly and laterally. IMPRESSION: 1. Bimalleolar fracture, with lateral subluxation of the tibiotalar joint. 2. Diffuse soft tissue swelling. Electronically Signed   By: Ozell Daring M.D.   On: 01/28/2025 15:19     .Sedation  Date/Time: 01/28/2025 3:58 PM  Performed by: Armenta Canning, MD Authorized by: Armenta Canning, MD   Consent:    Consent obtained:  Verbal   Consent given by:  Patient   Risks discussed:   Allergic reaction, dysrhythmia, inadequate sedation, vomiting, nausea, respiratory compromise necessitating ventilatory assistance and intubation, prolonged sedation necessitating reversal and prolonged hypoxia resulting in organ damage Universal protocol:    Immediately prior to procedure, a time out was called: yes     Patient identity confirmed:  Verbally with patient Indications:    Procedure performed:  Fracture reduction   Procedure necessitating sedation performed by:  Physician performing sedation Pre-sedation assessment:    Time since last food or drink:  10am   NPO status caution: urgency dictates proceeding with non-ideal NPO status     ASA classification: class 1 - normal, healthy patient     Mouth opening:  3 or more finger widths   Thyromental distance:  4 finger widths   Mallampati score:  I - soft palate, uvula, fauces, pillars visible  Pre-sedation assessments completed and reviewed: airway patency, cardiovascular function, hydration status, mental status, nausea/vomiting, pain level, respiratory function and temperature   A pre-sedation assessment was completed prior to the start of the procedure Immediate pre-procedure details:    Reviewed: vital signs, relevant labs/tests and NPO status     Verified: bag valve mask available, emergency equipment available, intubation equipment available, IV patency confirmed, oxygen available, reversal medications available and suction available   Procedure details (see MAR for exact dosages):    Sedation:  Propofol    Intended level of sedation: deep   Total Provider sedation time (minutes):  20 Post-procedure details:   A post-sedation assessment was completed following the completion of the procedure.   Attendance: Constant attendance by certified staff until patient recovered     Recovery: Patient returned to pre-procedure baseline     Patient is stable for discharge or admission: yes     Procedure completion:  Tolerated with  difficulty Comments:     Total of 200 mg of propofol  used.  Patient had sedation but maintained level of alertness and engagement during the reduction.  For future, I would suggest adding ketamine.    Medications Ordered in the ED  propofol  (DIPRIVAN ) 10 mg/mL bolus/IV push 44 mg (200 mg Intravenous Total Dose 01/28/25 1746)  propofol  (DIPRIVAN ) 10 mg/mL bolus/IV push (20 mg Intravenous Given 01/28/25 1716)  HYDROmorphone  (DILAUDID ) injection 1 mg (1 mg Intravenous Given 01/28/25 1724)  ketorolac  (TORADOL ) 30 MG/ML injection 30 mg (30 mg Intravenous Given 01/28/25 1849)  HYDROmorphone  (DILAUDID ) injection 1 mg (1 mg Intravenous Given 01/28/25 1851)                                    Medical Decision Making Amount and/or Complexity of Data Reviewed Radiology: ordered.  Risk Prescription drug management.   Patient presents as outlined above with a fall on ice.  He has bimalleolar ankle fracture.  No other injuries.  I have personally reviewed the x-rays.  Patient sedated with propofol  for reduction.  Patient required a large amount of propofol  but did not have complete sedation.  For future reference I would suggest adding ketamine or fentanyl .  Alignment significantly improved.  Patient was placed in posterior and stirrup splint with help of nurse tech.  Multiple rechecks on the patient.  He complained of anterior lower leg pain.  Ice pack has been placed and medications administered.  With rechecks patient reports he still has some pain but he is much more comfortable in appearance.  He denies any numbness or tingling of the toes.  He maintains neurologic function and brisk cap refill.  Consult: I have reviewed with Dr. Ernie.  Suggest patient follow-up in the office with Dr. Marquita  or Dr. Kit.  I have reviewed return precautions and at home care with elevating icing and weightbearing.  Patient voices understanding.     Final diagnoses:  Closed bimalleolar fracture of left ankle,  initial encounter    ED Discharge Orders          Ordered    oxyCODONE -acetaminophen  (PERCOCET) 5-325 MG tablet  Every 6 hours PRN        01/28/25 2008    ibuprofen  (ADVIL ) 800 MG tablet  3 times daily        01/28/25 2008               Armenta Canning, MD 01/28/25 2037  "

## 2025-01-28 NOTE — ED Notes (Signed)
 Pt drinking water, no s/s of nausea vomiting or difficulty swallowing.

## 2025-01-28 NOTE — ED Notes (Addendum)
 Consent signature obtained for ankle reduction

## 2025-01-28 NOTE — ED Triage Notes (Signed)
 Mechanical slip and fall on ice yesterday, injuring left ankle, reports pain and swelling since.  Pt has not taken any pain medicine yet today.   Denies head injury or other injury in fall. AAOx4

## 2025-02-01 ENCOUNTER — Encounter (HOSPITAL_BASED_OUTPATIENT_CLINIC_OR_DEPARTMENT_OTHER): Payer: Self-pay | Admitting: Orthopaedic Surgery

## 2025-02-01 ENCOUNTER — Other Ambulatory Visit: Payer: Self-pay | Admitting: Orthopaedic Surgery

## 2025-02-01 ENCOUNTER — Ambulatory Visit
Admission: RE | Admit: 2025-02-01 | Discharge: 2025-02-01 | Disposition: A | Source: Ambulatory Visit | Attending: Orthopaedic Surgery

## 2025-02-01 DIAGNOSIS — S82892A Other fracture of left lower leg, initial encounter for closed fracture: Secondary | ICD-10-CM

## 2025-02-07 ENCOUNTER — Ambulatory Visit (HOSPITAL_BASED_OUTPATIENT_CLINIC_OR_DEPARTMENT_OTHER): Admission: RE | Admit: 2025-02-07 | Source: Home / Self Care | Admitting: Orthopaedic Surgery

## 2025-02-07 ENCOUNTER — Encounter (HOSPITAL_BASED_OUTPATIENT_CLINIC_OR_DEPARTMENT_OTHER): Admission: RE | Payer: Self-pay | Source: Home / Self Care

## 2025-02-07 DIAGNOSIS — Z01818 Encounter for other preprocedural examination: Secondary | ICD-10-CM
